# Patient Record
Sex: Male | Born: 1960 | Race: White | Hispanic: No | Smoking: Never smoker
Health system: Southern US, Community
[De-identification: ages and names within clinical notes are randomized; demographics above are authoritative.]

## PROBLEM LIST (undated history)

## (undated) DIAGNOSIS — G4730 Sleep apnea, unspecified: Secondary | ICD-10-CM

## (undated) DIAGNOSIS — R030 Elevated blood-pressure reading, without diagnosis of hypertension: Secondary | ICD-10-CM

## (undated) DIAGNOSIS — E119 Type 2 diabetes mellitus without complications: Secondary | ICD-10-CM

## (undated) DIAGNOSIS — I10 Essential (primary) hypertension: Secondary | ICD-10-CM

## (undated) DIAGNOSIS — Z9109 Other allergy status, other than to drugs and biological substances: Secondary | ICD-10-CM

## (undated) DIAGNOSIS — Z9889 Other specified postprocedural states: Secondary | ICD-10-CM

## (undated) HISTORY — DX: Other allergy status, other than to drugs and biological substances: Z91.09

## (undated) HISTORY — DX: Type 2 diabetes mellitus without complications: E11.9

## (undated) HISTORY — PX: WISDOM TOOTH EXTRACTION: SHX21

## (undated) HISTORY — DX: Elevated blood-pressure reading, without diagnosis of hypertension: R03.0

## (undated) HISTORY — DX: Sleep apnea, unspecified: G47.30

---

## 1979-07-24 HISTORY — PX: SHOULDER SURGERY: SHX246

## 2002-12-17 ENCOUNTER — Encounter: Payer: Self-pay | Admitting: Emergency Medicine

## 2002-12-17 ENCOUNTER — Emergency Department (HOSPITAL_COMMUNITY): Admission: EM | Admit: 2002-12-17 | Discharge: 2002-12-17 | Payer: Self-pay | Admitting: Emergency Medicine

## 2004-09-05 ENCOUNTER — Inpatient Hospital Stay (HOSPITAL_COMMUNITY): Admission: EM | Admit: 2004-09-05 | Discharge: 2004-09-05 | Payer: Self-pay | Admitting: Emergency Medicine

## 2008-01-27 ENCOUNTER — Emergency Department (HOSPITAL_COMMUNITY): Admission: EM | Admit: 2008-01-27 | Discharge: 2008-01-28 | Payer: Self-pay | Admitting: Emergency Medicine

## 2010-12-08 NOTE — Discharge Summary (Signed)
NAME:  Pedro Moyer, Pedro Moyer NO.:  [id number]   MEDICAL RECORD NO.:  [id number]          PATIENT TYPE:  INP   LOCATION:  1830                         FACILITY:  [facility]   PHYSICIAN:  Pedro Moyer, M.D. DATE OF BIRTH:  [birth year]   DATE OF ADMISSION:  09/05/2004  DATE OF DISCHARGE:  09/05/2004                                 DISCHARGE SUMMARY   DISCHARGE DIAGNOSES:  1.  Chest pain, negative myocardial infarction.      1.  Cardiac catheterization with patent coronary arteries.      2.  Most likely gastrointestinal source of pain.  2.  Obstructive sleep apnea.  3.  Gastroesophageal reflux disease.   CONDITION ON DISCHARGE:  Stable and improved.   PROCEDURES:  Heart cath on September 05, 2004, by Dr. Julieanne Moyer, with  patent coronary arteries.   DISCHARGE MEDICATIONS:  Protonix 40 mg daily.   ACTIVITY:  No strenuous activity for three days.   WOUND CARE:  For right groin cath site, please call our office with any  bleeding, swelling, or drainage.   FOLLOWUP:  See Dr. Clarene Moyer on September 06, 2004, at his office at 9:15 a.m.   HISTORY OF PRESENT ILLNESS:  Was asked by Dr. Lendell Moyer to see 50 year old  white male, also per the request of the patient to see Dr. Clarene Moyer.  The  patient in his usual state of health, but has had a significant amount of  stress secondary to going through a marital separation with his second wife  and many social issues involved.  On the morning of September 05, 2004, he  was awakened by left anterior chest pain with radiation down his left arm,  felt like he could not breathe well even with his CPAP.  Pain improved with  nitroglycerin paste in the ER.  He was pain-free when he was seen by  cardiology in the ER, sinus rhythm with incomplete right bundle branch  block.  He took two aspirin at home prior to arrival.   PAST MEDICAL HISTORY:  1.  Cardiac negative.  2.  Obstructive sleep apnea with CPAP.  3.  Gastroesophageal reflux  disease.   ALLERGIES:  CODEINE, no shellfish allergies.   OUTPATIENT MEDICATIONS:  None.   FAMILY HISTORY:  Mother died of lung cancer, father died of cirrhosis of the  liver.  One grandmother died with a MI, but she was elderly.  Six brothers,  one sister, no coronary disease.   SOCIAL HISTORY:  Separated from his second wife.  His first wife is a Engineer, civil (consulting)  that is a friend and supportive.  Children:  One daughter and one son.  No  exercise.  No tobacco or alcohol use, no drugs.   REVIEW OF SYSTEMS:  See H&P.   PHYSICAL EXAMINATION:  VITAL SIGNS:  Blood pressure 139/82, pulse 70,  respirations 18, temperature 97.4, oxygen saturation on room air is 96%.  GENERAL:  Alert and oriented x3.  No acute distress.  SKIN:  Warm and dry, brisk capillary refill.  HEENT:  Sclerae clear.  NECK:  Supple, no JVD, no bruits.  HEART:  S1 and S2, regular rate and rhythm.  LUNGS:  Clear without wheezes, rhonchi, or rales.  ABDOMEN:  Positive bowel sounds, soft, nontender.  EXTREMITIES:  2+ pedal pulses, no lower extremity edema.  NEUROLOGIC:  Alert and oriented x3.  Moves all extremities.   LABORATORY DATA:  Hemoglobin 14.8, hematocrit 41.7, platelets 222, and WBC's  6.1.  Sodium 139, potassium 3.8, BUN 14, creatinine 1.3.  MB's are 2.6 and  1.5, troponin-I are less than 0.05 x2, and myoglobin was 170 to 278.  On EKG  showed sinus rhythm with an incomplete right bundle branch block.  Portable  chest x-ray showed no active disease.  Cardiac catheterization showed patent  coronary arteries, normal left ventricular systolic function, ejection  fraction 72%.   HOSPITAL COURSE:  [name] was admitted by Dr. Lendell Moyer on call for Dr.  Manus Moyer as hospitalist, and asked for a cardiology consult secondary to  chest pain.  Consult was done.  The patient when given options of stress  test versus cardiac catheterization with his ex-wife's input, felt cardiac  catheterization would satisfy his mind better with  the pain that awakened  him in the middle of the night.  Dr. Clarene Moyer saw him and assessed him.  Plans  were made for cardiac catheterization.   Cardiac catheterization was completed without complications.  The patient  did indeed have patent coronary arteries and normal EF.  Reassured from a  cardiology standpoint.  Since he does have gastroesophageal reflux disease,  I will put him on Protonix 40 mg daily.   Dr. Clarene Moyer notified Dr. Lendell Moyer of results and with her permission we are  discharging him after his bed rest from his cardiac catheterization.      LRI/MEDQ  D:  09/05/2004  T:  09/05/2004  Job:  [id number]   cc:   Pedro L. Pedro Caprice, MD   Pedro Moyer, M.D.  [address] [place]  [place] [address]  Fax: [phone]

## 2010-12-08 NOTE — Cardiovascular Report (Signed)
NAME:  Pedro Moyer, Pedro Moyer NO.:  [id number]   MEDICAL RECORD NO.:  [id number]          PATIENT TYPE:  INP   LOCATION:  1830                         FACILITY:  [facility]   PHYSICIAN:  Thereasa Solo. Little, M.D. DATE OF BIRTH:  [birth year]   DATE OF PROCEDURE:  09/05/2004  DATE OF DISCHARGE:                              CARDIAC CATHETERIZATION   This 50 year old obese male presented with left upper chest and arm  discomfort, presented to the emergency room with chest pain suspicious for  angina.  He has a negative family history for heart disease, but does have  obstructive sleep apnea.   After obtaining informed consent, the patient was prepped and draped in the  usual sterile fashion, exposing the right groin.  Following local anesthetic  with 1% Xylocaine, the Seldinger technique was employed and a 5 [place]  __________ contrast introducer sheath was placed into the right femoral  artery.  Left and right coronary arteriography and ventriculography in the  RAO projection was performed.   COMPLICATIONS:  None.   EQUIPMENT:  5 French Judkins configuration catheters.   TOTAL CONTRAST:  90 mL.   RESULTS:   HEMODYNAMIC MONITORING:  Central aortic pressure 119/77.  Left ventricular  pressure 119/15.  No aortic valve gradient was noted at the time of pull-  back.   VENTRICULOGRAPHY:  Ventriculography in the ROA projection revealed normal LV  systolic function, ejection fraction greater than 60%.  End-diastolic  pressure was 24.   CORONARY ARTERIOGRAPHY:  1.  Left main normal.  It bifurcated.  2.  LAD:  The LAD was a large vessel greater than 4 mm.  There were two      diagonal vessels, all of which were free of disease.  3.  Circumflex:  The circumflex gave rise to two OMs.  The first OM      bifurcated.  The second OM was moderate-sized, both of which were      normal.  4.  Right coronary artery:  The right coronary artery was a large vessel at      3.5 mm, giving rise  to the PDA and posterolateral vessel.  They were      free of disease.   CONCLUSION:  1.  Normal left ventricular systolic function.  2.  No evidence of coronary artery disease.   I cannot explain his chest pain from a cardiac standpoint.  I see no reason  he needs to stay in the hospital overnight.  We will discharge home if okay  with Dr. Lendell Caprice.      ABL/MEDQ  D:  09/05/2004  T:  09/05/2004  Job:  [id number]   cc:   Bryan Lemma. Manus Gunning, M.D.  [address] [place]  [place] [address]  Fax: [phone]   Cath Lab

## 2010-12-08 NOTE — H&P (Signed)
NAME:  Pedro Moyer, Pedro Moyer NO.:  [id number]   MEDICAL RECORD NO.:  [id number]          PATIENT TYPE:  EMS   LOCATION:  MAJO                         FACILITY:  [facility]   PHYSICIAN:  Duke Salvia, M.D.  DATE OF BIRTH:  [birth year]   DATE OF ADMISSION:  09/05/2004  DATE OF DISCHARGE:                                HISTORY & PHYSICAL   CHIEF COMPLAINT:  Chest pressure.   HISTORY OF PRESENT ILLNESS:  Pedro Moyer is a 50 year old white male patient of  Dr. Manus Gunning, who presents to the emergency room with chest pressure and pain  radiating down his left arm.  He reports that his pain has resolved.  He was  asleep when it started.  He had no accompanying symptoms.  He has felt this  in the past, mainly related to stress.  He has noted that he has been under  a lot of stress recently.  He also has a history of reflux symptoms, but is  on no regular medications for this.  He will take Pepcid and TUMS as needed.  He has never had a stress test.  He has no cardiac risk factors.   PAST MEDICAL HISTORY:  Obstructive sleep apnea for which he takes CPAP at  night.   MEDICATIONS:  1.  He took two aspirin today and takes it approximately weekly for this      chest pressure.  2.  He takes Pepcid and TUMS as needed.   FAMILY HISTORY:  His mother died of lung cancer.  His father died of  cirrhosis from alcohol.   SOCIAL HISTORY:  The patient does not drink, smoke, or use drugs.  He is a  Medical illustrator.  He is recently separated from his wife.   REVIEW OF SYSTEMS:  GENERAL:  No fevers, chills, or weight loss.  HEENT:  No headache, no sore throat.  No rhinorrhea.  LUNGS:  No cough, no shortness of breath.  HEART:  As above.  GASTROINTESTINAL:  He reports frequent heartburn.  GENITOURINARY:  No dysuria or hematuria.  MUSCULOSKELETAL:  No arthralgias or myalgias.  SKIN:  No rash.  PSYCHIATRIC:  As above.  NEUROLOGY:  No stroke or seizure.  ENDOCRINE:  No diabetes.  HEMATOLOGIC:  No  history of thromboembolism.   PHYSICAL EXAMINATION:  VITAL SIGNS:  Temperature 97.4, blood pressure  139/82, pulse 70, respiratory rate 18, oxygen saturation 96% on room air.  GENERAL:  The patient is well-nourished, well-developed, and in no acute  distress.  HEENT:  Normocephalic and atraumatic.  Pupils equal, round, and reactive to  light.  Sclerae nonicteric.  Moist mucous membranes.  NECK:  Thick and supple.  No carotid bruits.  No thyromegaly.  No  lymphadenopathy.  LUNGS:  Clear to auscultation bilaterally without wheezes, rhonchi, or  rales.  HEART:  Regular rate and rhythm without murmurs, rubs, or gallops.  No chest  wall tenderness.  ABDOMEN:  Soft, nontender, and nondistended.  GENITOURINARY:  RECTAL:  Deferred.  EXTREMITIES:  No cyanosis, clubbing, or edema.  No calf tenderness.  Homan's  sign negative.  Pulses  are intact.  PSYCHIATRIC:  Normal affect.  NEUROLOGY:  Alert and oriented.  Cranial nerves and sensory, motor  examination are intact.  SKIN:  No rash.   LABORATORY DATA:  First set of point of care enzymes were normal.  Repeat  point of care enzymes show myoglobin of 278, troponin and MB fraction were  normal.  Basic metabolic panel unremarkable.  CBC unremarkable.  EKG shows  normal sinus rhythm.  Chest x-ray negative.   ASSESSMENT:  1.  Chest pain.  The patient will be admitted to telemetry.  I will continue      the aspirin.  He has received Nitropaste and I will continue this.  I      will continue oxygen.  He will get serial cardiac enzymes.  He will most      likely need a stress Cardiolite.  He requests that Gaspar Garbe B. Little,      M.D. be the consulting physician for this test.  Apparently his      patient's ex-wife is a nurse on the oncology unit and knows Dr. Clarene Duke.      I have called the consult in to Dr. Fredirick Maudlin office.  2.  Obstructive sleep apnea.  Continue CPAP.  3.  Gastroesophageal reflux disease.  I will start the patient on  Protonix.      CLS/MEDQ  D:  09/05/2004  T:  09/05/2004  Job:  [id number]   cc:   Bryan Lemma. Manus Gunning, M.D.  [address] [place]  [place] [address]  Fax: [phone]

## 2013-09-10 ENCOUNTER — Institutional Professional Consult (permissible substitution): Payer: Self-pay | Admitting: Internal Medicine

## 2014-02-05 ENCOUNTER — Institutional Professional Consult (permissible substitution): Payer: Self-pay | Admitting: Internal Medicine

## 2014-03-19 ENCOUNTER — Encounter (INDEPENDENT_AMBULATORY_CARE_PROVIDER_SITE_OTHER): Payer: Self-pay

## 2014-03-19 ENCOUNTER — Ambulatory Visit (INDEPENDENT_AMBULATORY_CARE_PROVIDER_SITE_OTHER): Payer: Managed Care, Other (non HMO) | Admitting: Internal Medicine

## 2014-03-19 ENCOUNTER — Encounter: Payer: Self-pay | Admitting: Internal Medicine

## 2014-03-19 VITALS — BP 132/68 | HR 67

## 2014-03-19 DIAGNOSIS — G4733 Obstructive sleep apnea (adult) (pediatric): Secondary | ICD-10-CM

## 2014-03-19 NOTE — Progress Notes (Signed)
03/19/14- 53 yoM never smoker Former patient-[facility];had sleep study about 16 years ago, coming to re-establish. Says he is still using the same machine with no adjustment, all night, every night with no problems. No recent masks. Straps are worn out.  Bedtime 10-11PM, latency 5-10 minutes, waking 3-4 times before up 6-7AM. Weight up 10 lbs since dx. No ENT surgery, "borderline" HBP, some nasal congestion/ allergic rhinitis. Married, works for [employer]. Brother has OSA.  Prior to Admission medications   Not on File   Past Medical History  Diagnosis Date  . Sleep apnea   . Borderline hypertension   . Environmental allergies    Past Surgical History  Procedure Laterality Date  . Shoulder surgery  1981   Family History  Problem Relation Age of Onset  . Lung cancer Mother   . Other Father     blood disease-ETOH abuse   History   Social History  . Marital Status: Married    Spouse Name: N/A    Number of Children: 5  . Years of Education: N/A   Occupational History  . 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 Smoking status: Never Smoker   . Smokeless tobacco: Not on file  . Alcohol Use: No  . Drug Use: No  . Sexual Activity: Not on file   Other Topics Concern  . Not on file   Social History Narrative  . No narrative on file   ROS-see HPI Constitutional:   No-   weight loss, night sweats, fevers, chills, fatigue, lassitude. HEENT:   No-  headaches, difficulty swallowing, tooth/dental problems, sore throat,       No-  sneezing, itching, ear ache, nasal congestion, post nasal drip,  CV:  No-   chest pain, orthopnea, PND, swelling in lower extremities, anasarca,                                  dizziness, palpitations Resp: No-   shortness of breath with exertion or at rest.              No-   productive cough,  No non-productive cough,  No- coughing up of blood.              No-   change in color of mucus.  No- wheezing.   Skin: No-   rash or  lesions. GI:  No-   heartburn, indigestion, abdominal pain, nausea, vomiting, diarrhea,                 change in bowel habits, loss of appetite GU: No-   dysuria, change in color of urine, no urgency or frequency.  No- flank pain. MS:  No-   joint pain or swelling.  No- decreased range of motion.  No- back pain. Neuro-     nothing unusual Psych:  No- change in mood or affect. No depression or anxiety.  No memory loss.  OBJ- Physical Exam General- Alert, Oriented, Affect-appropriate, Distress- none acute, mild overweight Skin- rash-none, lesions- none, excoriation- none Lymphadenopathy- none Head- atraumatic            Eyes- Gross vision intact, PERRLA, conjunctivae and secretions clear            Ears- Hearing, canals-normal            Nose- Clear, no-Septal dev, mucus, polyps, erosion, perforation  Throat- Mallampati III , mucosa clear , drainage- none, tonsils- atrophic Neck- flexible , trachea midline, no stridor , thyroid nl, carotid no bruit Chest - symmetrical excursion , unlabored           Heart/CV- RRR , no murmur , no gallop  , no rub, nl s1 s2                           - JVD- none , edema- none, stasis changes- none, varices- none           Lung- clear to P&A, wheeze- none, cough- none , dullness-none, rub- none           Chest wall-  Abd- tender-no, distended-no, bowel sounds-present, HSM- no Br/ Gen/ Rectal- Not done, not indicated Extrem- cyanosis- none, clubbing, none, atrophy- none, strength- nl Neuro- grossly intact to observation

## 2014-03-19 NOTE — Assessment & Plan Note (Signed)
After 16 years, we will need new sleep study to update documentation and support prescription for new machine, supplies and pressure setting.

## 2014-03-19 NOTE — Patient Instructions (Signed)
Order- schedule split protocol NPSG    Dx OSA 

## 2014-04-27 ENCOUNTER — Ambulatory Visit (HOSPITAL_BASED_OUTPATIENT_CLINIC_OR_DEPARTMENT_OTHER): Payer: Managed Care, Other (non HMO) | Attending: Internal Medicine | Admitting: Radiology

## 2014-04-27 VITALS — Ht 71.0 in | Wt 239.0 lb

## 2014-04-27 DIAGNOSIS — G4730 Sleep apnea, unspecified: Secondary | ICD-10-CM | POA: Diagnosis present

## 2014-04-27 DIAGNOSIS — Z9989 Dependence on other enabling machines and devices: Secondary | ICD-10-CM

## 2014-04-27 DIAGNOSIS — G4733 Obstructive sleep apnea (adult) (pediatric): Secondary | ICD-10-CM | POA: Diagnosis not present

## 2014-05-05 DIAGNOSIS — G4733 Obstructive sleep apnea (adult) (pediatric): Secondary | ICD-10-CM

## 2014-05-05 NOTE — Sleep Study (Addendum)
  NAME: Pedro Moyer  DATE OF BIRTH: 01/14/1961  MEDICAL RECORD [id number]  LOCATION: [facility]   PHYSICIAN: Airiel Oblinger D   DATE OF STUDY: 04/27/2014   SLEEP STUDY TYPE: Nocturnal Polysomnogram   REFERRING PHYSICIAN: Jetty DuhamelYoung, Collen Hostler D, MD   INDICATION FOR STUDY: Hypersomnia with sleep apnea   EPWORTH SLEEPINESS SCORE: 9/24   HEIGHT: 5\' 11"  (180.3 cm)  WEIGHT: 239 lb (108.41 kg) Body mass index is 33.35 kg/(m^2).  NECK SIZE: 17 in.  MEDICATIONS: Charted for review   SLEEP ARCHITECTURE: Sleep study protocol. During the diagnostic phase, total sleep time 121.5 minutes with sleep efficiency 60%. Stage I was 27.2%, stage II 72.8%, stage III and REM were absent. Sleep latency 4 minutes, awake after sleep onset 70.5 minutes, arousal index 74.6, bedtime medication: None   RESPIRATORY DATA: Apnea hypopneas index (AHI) 69.6 per hour. 141 total events scored including 88 obstructive apneas, 28 central apneas, 9 mixed apneas, 16 hypopneas. Events were not positional.  CPAP titration to 10 CWP, AHI 0 per hour. He wore a large nasal mask.   OXYGEN DATA: Moderate snoring before CPAP with oxygen desaturation to a nadir of 88% on room air. With CPAP control, snoring was prevented and mean oxygen saturation was 95.2%.   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 Moderate snoring with oxygen desaturation to a nadir of 88% on room air.  2) Successful CPAP titration to 10 CWP, AHI 0 per hour. He wore a large Repironics WISP nasal mask with heated humidifier. Snoring was prevented and mean oxygen saturation was 95.2% on room air.  Waymon BudgeYOUNG,Sammuel Blick D  Diplomate, American Board of Sleep Medicine  ELECTRONICALLY SIGNED ON: 05/05/2014, 1:48 PM   [facility]  PH: [phone] FX: [phone]) [phone]  ACCREDITED BY THE AMERICAN ACADEMY OF SLEEP MEDICINE

## 2014-05-05 NOTE — Sleep Study (Deleted)
   NAME: Pedro LangoRichard L Moyer DATE OF BIRTH:  May 16, 1961 MEDICAL RECORD NUMBER [id number]  LOCATION: [facility]  PHYSICIAN: Kalila Adkison D  DATE OF STUDY: 04/27/2014  SLEEP STUDY TYPE: Nocturnal Polysomnogram               REFERRING PHYSICIAN: Jetty DuhamelYoung, Nayef College D, MD  INDICATION FOR STUDY: Hypersomnia with sleep apnea  EPWORTH SLEEPINESS SCORE:   9/24 HEIGHT: 5\' 11"  (180.3 cm)  WEIGHT: 239 lb (108.41 kg)    Body mass index is 33.35 kg/(m^2).  NECK SIZE: 17 in.  MEDICATIONS: Charted for review  SLEEP ARCHITECTURE: Sleep study protocol. During the diagnostic phase, total sleep time 121.5 minutes with sleep efficiency 60%. Stage I was 27.2%, stage II 72.8%, stage III and REM were absent. Sleep latency 4 minutes, awake after sleep onset 70.5 minutes, arousal index 74.6, bedtime medication: None  RESPIRATORY DATA: Apnea hypopneas index (AHI) 69.6 per hour. 141 total events scored including 88 obstructive apneas, 28 central apneas, 9 mixed apneas, 16 hypopneas. Events were not positional. CPAP titration to 10 CWP, AHI 0 per hour. He wore a large nasal mask.  OXYGEN DATA: Moderate snoring before CPAP with oxygen desaturation to a nadir of 88% on room air. With CPAP control, snoring was prevented and mean oxygen saturation was 95.2%.  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 Moderate snoring with oxygen desaturation to a nadir of 88% on room air. 2) Successful CPAP titration to 10 CWP, AHI 0 per hour. He wore a large Repironix    Waymon BudgeYOUNG,Britni Driscoll D Diplomate, Biomedical engineerAmerican Board of Sleep Medicine  ELECTRONICALLY SIGNED ON:  05/05/2014, 1:48 PM [facility] PH: [phone]   FX: [phone]) [phone] ACCREDITED BY THE AMERICAN ACADEMY OF SLEEP  MEDICINE

## 2014-05-13 ENCOUNTER — Ambulatory Visit: Payer: Managed Care, Other (non HMO) | Admitting: Internal Medicine

## 2014-05-17 ENCOUNTER — Encounter: Payer: Self-pay | Admitting: Internal Medicine

## 2014-05-17 ENCOUNTER — Ambulatory Visit (INDEPENDENT_AMBULATORY_CARE_PROVIDER_SITE_OTHER): Payer: Managed Care, Other (non HMO) | Admitting: Internal Medicine

## 2014-05-17 ENCOUNTER — Encounter (INDEPENDENT_AMBULATORY_CARE_PROVIDER_SITE_OTHER): Payer: Self-pay

## 2014-05-17 VITALS — BP 176/100 | HR 75 | Ht 71.0 in | Wt 240.8 lb

## 2014-05-17 DIAGNOSIS — G4733 Obstructive sleep apnea (adult) (pediatric): Secondary | ICD-10-CM

## 2014-05-17 NOTE — Patient Instructions (Signed)
Order- new DME new CPAP 10, mask of choice, humidifier, supplies     Dx OSA

## 2014-05-17 NOTE — Progress Notes (Signed)
03/19/14- 53 yoM never smoker Former patient-[facility];had sleep study about 16 years ago, coming to re-establish. Says he is still using the same machine with no adjustment, all night, every night with no problems. No recent masks. Straps are worn out.  Bedtime 10-11PM, latency 5-10 minutes, waking 3-4 times before up 6-7AM. Weight up 10 lbs since dx. No ENT surgery, "borderline" HBP, some nasal congestion/ allergic rhinitis. Married, works for [employer]. Brother has OSA.  05/17/14- 53 yoM never smoker followed for OSA FOLLOW FOR:  OSA; discuss sleep study results;  current CPAP 54 years old, set at 15cm; uses every night 8 hours nightly NPSG 04/27/14- severe obstructive and central sleep apnea, AHI 69.6 per hour, C Pap 10, weight 239 pounds He qualifies to get a new machine. We discussed sleep hygiene made difficult because his wife is a night owl and her noise at night disturbed him  ROS-see HPI Constitutional:   No-   weight loss, night sweats, fevers, chills, fatigue, lassitude. HEENT:   No-  headaches, difficulty swallowing, tooth/dental problems, sore throat,       No-  sneezing, itching, ear ache, nasal congestion, post nasal drip,  CV:  No-   chest pain, orthopnea, PND, swelling in lower extremities, anasarca,                                  dizziness, palpitations Resp: No-   shortness of breath with exertion or at rest.              No-   productive cough,  No non-productive cough,  No- coughing up of blood.              No-   change in color of mucus.  No- wheezing.   Skin: No-   rash or lesions. GI:  No-   heartburn, indigestion, abdominal pain, nausea, vomiting, GU:  MS:  No-   joint pain or swelling.   Neuro-     nothing unusual Psych:  No- change in mood or affect. No depression or anxiety.  No memory loss.  OBJ- Physical Exam General- Alert, Oriented, Affect-appropriate, Distress- none acute, mild overweight Skin- rash-none, lesions- none, excoriation- none Lymphadenopathy-  none Head- atraumatic            Eyes- Gross vision intact, PERRLA, conjunctivae and secretions clear            Ears- Hearing, canals-normal            Nose- Clear, no-Septal dev, mucus, polyps, erosion, perforation             Throat- Mallampati III , mucosa clear , drainage- none, tonsils- atrophic Neck- flexible , trachea midline, no stridor , thyroid nl, carotid no bruit Chest - symmetrical excursion , unlabored           Heart/CV- RRR , no murmur , no gallop  , no rub, nl s1 s2                           - JVD- none , edema- none, stasis changes- none, varices- none           Lung- clear to P&A, wheeze- none, cough- none , dullness-none, rub- none           Chest wall-  Abd-  Br/ Gen/ Rectal- Not done, not indicated Extrem- cyanosis- none, clubbing,  none, atrophy- none, strength- nl Neuro- grossly intact to observation

## 2014-05-23 NOTE — Assessment & Plan Note (Signed)
Severe obstructive sleep apnea qualifying for the replacement CPAP machine he needs.

## 2014-07-19 ENCOUNTER — Ambulatory Visit: Payer: Managed Care, Other (non HMO) | Admitting: Internal Medicine

## 2014-11-10 ENCOUNTER — Telehealth: Payer: Self-pay | Admitting: Internal Medicine

## 2014-11-10 DIAGNOSIS — G4733 Obstructive sleep apnea (adult) (pediatric): Secondary | ICD-10-CM

## 2014-11-10 NOTE — Telephone Encounter (Signed)
Order has been placed for new machine. Advised that he would need appointment soon. ROV has been placed for 12/29/14 at 10:45am.

## 2014-12-06 ENCOUNTER — Telehealth: Payer: Self-pay | Admitting: Internal Medicine

## 2014-12-06 NOTE — Telephone Encounter (Signed)
No forms on pt as of today-what type of form is this and which fax number are they sending it to?  Thanks.

## 2014-12-06 NOTE — Telephone Encounter (Signed)
Katie, have you received anything  Please advise thanks

## 2014-12-07 NOTE — Telephone Encounter (Signed)
Received mold report fax. Called and spoke to pt's wife. Pt is needing additional information sent to insurance for CPAP to be covered--as pt had a new CPAP in November of 2015 and is now needing another one. Report and order faxed to [employer] at [phone] attention to PA as a STAT order. Pt's wife aware. [facility].   Will forward to [place] to follow.

## 2014-12-09 NOTE — Telephone Encounter (Signed)
Received fax from [employer] last Wednesday 12-08-14 afternoon; they state they are missing information (CPT code or other code(s) for CPAP machine needed). I have contacted [employer](pts DME company) and was given codes as follows:   E0601= CPAP machine E0562= humidifier for CPAP machine   I have faxed the following information to [phone]. Will wait for a decision from [employer].

## 2014-12-14 ENCOUNTER — Telehealth: Payer: Self-pay | Admitting: Internal Medicine

## 2014-12-14 NOTE — Telephone Encounter (Signed)
[facility] can you please advise what was faxed?

## 2014-12-14 NOTE — Telephone Encounter (Signed)
Pt himself is calling back and he says the info that was faxed for pt c-pap was waxed to wrong# which was the # they had given us, apparently they were wanting it faxed to the sleep management dept @ [phone] please advise as to if this can be faxed to them.Caren GriffinsStanley A Dalton'

## 2014-12-14 NOTE — Telephone Encounter (Signed)
The order for the new CPAP was placed in April 2016. This order went to [facility]. Called and spoke with Okey Regalarol at [facility]. She states that their hands are tied because he needs to call his insurance company. Okey RegalCarol has explained this to the pt's wife several times. Attempted to call pt's wife back but her voicemail has not been set up. Will try back.

## 2014-12-15 NOTE — Telephone Encounter (Signed)
Printed order and refaxed it to [phone] per pt's request tried to reach pt but no voice mail has been set up on his phone Tobe SosSally E Ottinger

## 2014-12-29 ENCOUNTER — Ambulatory Visit: Payer: Managed Care, Other (non HMO) | Admitting: Internal Medicine

## 2015-01-26 ENCOUNTER — Encounter: Payer: Self-pay | Admitting: Internal Medicine

## 2015-02-28 ENCOUNTER — Encounter: Payer: Self-pay | Admitting: Internal Medicine

## 2015-03-31 ENCOUNTER — Encounter: Payer: Self-pay | Admitting: Internal Medicine

## 2016-04-17 ENCOUNTER — Emergency Department (HOSPITAL_COMMUNITY): Payer: Managed Care, Other (non HMO)

## 2016-04-17 ENCOUNTER — Encounter (HOSPITAL_COMMUNITY): Payer: Self-pay | Admitting: Emergency Medicine

## 2016-04-17 ENCOUNTER — Telehealth: Payer: Self-pay | Admitting: Internal Medicine

## 2016-04-17 ENCOUNTER — Emergency Department (HOSPITAL_COMMUNITY)
Admission: EM | Admit: 2016-04-17 | Discharge: 2016-04-17 | Disposition: A | Discharge status: Home / Self Care | Payer: Managed Care, Other (non HMO) | Source: Home / Self Care | Attending: Emergency Medicine | Admitting: Emergency Medicine

## 2016-04-17 DIAGNOSIS — R0600 Dyspnea, unspecified: Secondary | ICD-10-CM | POA: Diagnosis not present

## 2016-04-17 DIAGNOSIS — R0602 Shortness of breath: Secondary | ICD-10-CM | POA: Insufficient documentation

## 2016-04-17 DIAGNOSIS — R739 Hyperglycemia, unspecified: Secondary | ICD-10-CM | POA: Insufficient documentation

## 2016-04-17 DIAGNOSIS — I10 Essential (primary) hypertension: Secondary | ICD-10-CM

## 2016-04-17 HISTORY — DX: Essential (primary) hypertension: I10

## 2016-04-17 LAB — BASIC METABOLIC PANEL
Anion gap: 10 (ref 5–15)
BUN: 12 mg/dL (ref 6–20)
CALCIUM: 9.3 mg/dL (ref 8.9–10.3)
CO2: 21 mmol/L — ABNORMAL LOW (ref 22–32)
CREATININE: 0.97 mg/dL (ref 0.61–1.24)
Chloride: 104 mmol/L (ref 101–111)
GFR calc Af Amer: >60 mL/min (ref >60)
GLUCOSE: 428 mg/dL — AB (ref 65–99)
Potassium: 3.6 mmol/L (ref 3.5–5.1)
SODIUM: 135 mmol/L (ref 135–145)

## 2016-04-17 LAB — CBC
HEMATOCRIT: 44.2 % (ref 39.0–52.0)
Hemoglobin: 16.1 g/dL (ref 13.0–17.0)
MCH: 29.5 pg (ref 26.0–34.0)
MCHC: 36.4 g/dL — AB (ref 30.0–36.0)
MCV: 81.1 fL (ref 78.0–100.0)
PLATELETS: 243 10*3/uL (ref 150–400)
RBC: 5.45 MIL/uL (ref 4.22–5.81)
RDW: 13.3 % (ref 11.5–15.5)
WBC: 7.8 10*3/uL (ref 4.0–10.5)

## 2016-04-17 LAB — I-STAT TROPONIN, ED: Troponin i, poc: 0.01 ng/mL (ref 0.00–0.08)

## 2016-04-17 LAB — BRAIN NATRIURETIC PEPTIDE: B NATRIURETIC PEPTIDE 5: 24.2 pg/mL (ref 0.0–100.0)

## 2016-04-17 MED ORDER — ALBUTEROL SULFATE HFA 108 (90 BASE) MCG/ACT IN AERS
1.0000 | INHALATION_SPRAY | Freq: Once | RESPIRATORY_TRACT | Status: AC
  Administered 2016-04-17: 1 via RESPIRATORY_TRACT
  Filled 2016-04-17: qty 6.7

## 2016-04-17 NOTE — ED Triage Notes (Addendum)
Pt. reports SOB with productive cough and chest congestion  onset this morning , denies fever or chills.

## 2016-04-17 NOTE — ED Provider Notes (Signed)
[facility]EMER[facility]Y DEPT Provider Note   CSN: [id number] Arrival date & time: 04/17/16  [id number]     History   Chief Complaint Chief Complaint  Patient presents with  . Shortness of Breath    HPI Pedro Moyer is a 55 y.o. male.  The history is provided by the patient and medical records. No language interpreter was used.  Shortness of Breath  Associated symptoms include cough (Chronic - no change from usual). Pertinent negatives include no fever, no headaches, no chest pain, no abdominal pain, no rash and no leg swelling.   Pedro Moyer is a 55 y.o. male  with a PMH of sleep apnea, HTN who presents to the Emergency Department complaining of shortness of breath that began this morning around 3am. Patient states that he woke up to use the bathroom and felt like he couldn't get a deep breath. He then started feeling anxious and feels like that worsened breathing. He endorses chronic cough with occasional phlegm but no change in cough over the last week. No fevers, congestion, chest pain, n/v, diaphoresis or back pain.   Past Medical History:  Diagnosis Date  . Borderline hypertension   . Environmental allergies   . Hypertension   . Sleep apnea     Patient Active Problem List   Diagnosis Date Noted  . Obstructive sleep apnea 03/19/2014    Past Surgical History:  Procedure Laterality Date  . SHOULDER SURGERY  1981       Home Medications    Prior to Admission medications   Not on File    Family History Family History  Problem Relation Age of Onset  . Lung cancer Mother   . Other Father     blood disease-ETOH abuse    Social History Social History  Substance Use Topics  . Smoking status: Never Smoker  . Smokeless tobacco: Never Used  . Alcohol use No     Allergies   Codeine   Review of Systems Review of Systems  Constitutional: Negative for fever.  HENT: Negative for congestion.   Eyes: Negative for visual disturbance.  Respiratory: Positive for cough  (Chronic - no change from usual) and shortness of breath. Negative for chest tightness.   Cardiovascular: Negative for chest pain, palpitations and leg swelling.  Gastrointestinal: Negative for abdominal pain.  Genitourinary: Negative for dysuria.  Musculoskeletal: Negative for back pain.  Skin: Negative for rash.  Neurological: Negative for headaches.     Physical Exam Updated Vital Signs BP 163/87 (BP Location: Right Arm)   Pulse 79   Temp 98.2 F (36.8 C) (Oral)   Resp 18   SpO2 99%   Physical Exam  Constitutional: He is oriented to person, place, and time. He appears well-developed and well-nourished. No distress.  Well appearing male speaking in full sentences, NAD.   HENT:  Head: Normocephalic and atraumatic.  OP clear and moist. No exudate.   Cardiovascular: Normal rate, regular rhythm, normal heart sounds and intact distal pulses.  Exam reveals no gallop and no friction rub.   No murmur heard. Pulmonary/Chest: Effort normal and breath sounds normal. No respiratory distress. He has no wheezes. He has no rales. He exhibits no tenderness.  99-100% O2 on RA throughout examination.   Abdominal: Soft. Bowel sounds are normal. He exhibits no distension. There is no tenderness.  Musculoskeletal: He exhibits no edema.  Neurological: He is alert and oriented to person, place, and time.  Skin: Skin is warm and dry.  Nursing note and  vitals reviewed.    ED Treatments / Results  Labs (all labs ordered are listed, but only abnormal results are displayed) Labs Reviewed - No data to display  EKG  EKG Interpretation  Date/Time:  Tuesday April 17 2016 06:43:08 EDT Ventricular Rate:  79 PR Interval:  154 QRS Duration: 92 QT Interval:  370 QTC Calculation: 424 R Axis:   48 Text Interpretation:  Normal sinus rhythm with sinus arrhythmia Incomplete right bundle branch block Borderline ECG When compared with ECG of 09/05/2004, No significant change was found Confirmed by [facility]   MD, DAVID ([id number]) on 04/17/2016 6:55:17 AM       Radiology Dg Chest 2 View  Result Date: 04/17/2016 CLINICAL DATA:  Shortness of breath and productive cough EXAM: CHEST  2 VIEW COMPARISON:  01/28/2008 FINDINGS: Normal heart size and mediastinal contours. No acute infiltrate or edema. No effusion or pneumothorax. No acute osseous findings. IMPRESSION: Negative chest. Electronically Signed   By: Marnee Spring M.D.   On: 04/17/2016 07:18    Procedures Procedures (including critical care time)  Medications Ordered in ED Medications  albuterol (PROVENTIL HFA;VENTOLIN HFA) 108 (90 Base) MCG/ACT inhaler 1-2 puff (1 puff Inhalation Given 04/17/16 0941)     Initial Impression / Assessment and Plan / ED Course  I have reviewed the triage vital signs and the nursing notes.  Pertinent labs & imaging results that were available during my care of the patient were reviewed by me and considered in my medical decision making (see chart for details).  Clinical Course   Pedro Moyer is a 55 y.o. male who presents to ED for shortness of breath that began this morning. No other associated symptoms. EKG reassuring. CXR unremarkable. Patient ambulated around department multiple times with no drop in O2 sats. He is well appearing. Evaluation does not show pathology that would require ongoing emergent intervention or inpatient treatment. Patient is hemodynamically stable and mentating appropriately. Followed by pulmonology for sleep apnea and patient agrees to follow up with pulm this week.  Return precautions discussed and all questions answered.   Patient discussed with Dr. Clayborne Dana who agrees with treatment plan.   Final Clinical Impressions(s) / ED Diagnoses   Final diagnoses:  SOB (shortness of breath)    New Prescriptions There are no discharge medications for this patient.    [facility], PA-C 04/17/16 1000    Marily Memos, MD 04/18/16 [phone]

## 2016-04-17 NOTE — ED Triage Notes (Signed)
Patient seen earlier today for shortness of breath.  Patient states that when he lays down he feels like he is suffocating.  EKG and chest xray normal this morning.  Patient states that he does not have any chest pain at this time.  He does have a cough, nonproductive, but always has a cough.

## 2016-04-17 NOTE — ED Notes (Signed)
Pt ambulating on room air, sats remain >95% and breathing is not labored.

## 2016-04-17 NOTE — Telephone Encounter (Signed)
Spoke with pt and he states that he started having increased ShOB early this morning. Pt was seen in ED this morning. He was given albuterol HFA to use and has noticed some improvement. Pt does admit to some level of anxiety that may be exacerbating his symptoms. Reviewed albuterol instructions and deep breathing techniques with pt. Scheduled appt with CY for tomorrow morning. Pt advised to return to ED if significant increase in symptoms or develops chest pain. Nothing further needed.

## 2016-04-17 NOTE — Discharge Instructions (Signed)
Please follow up with your pulmonologist this week.  Return to ER for chest pain, worsening shortness of breath, fevers, new or worsening symptoms, any additional concerns.

## 2016-04-18 ENCOUNTER — Emergency Department (HOSPITAL_COMMUNITY): Payer: Managed Care, Other (non HMO)

## 2016-04-18 ENCOUNTER — Ambulatory Visit (INDEPENDENT_AMBULATORY_CARE_PROVIDER_SITE_OTHER): Payer: Managed Care, Other (non HMO) | Admitting: Internal Medicine

## 2016-04-18 ENCOUNTER — Emergency Department (HOSPITAL_COMMUNITY)
Admission: EM | Admit: 2016-04-18 | Discharge: 2016-04-18 | Disposition: A | Discharge status: Home / Self Care | Payer: Managed Care, Other (non HMO) | Attending: Emergency Medicine | Admitting: Emergency Medicine

## 2016-04-18 ENCOUNTER — Encounter: Payer: Self-pay | Admitting: Internal Medicine

## 2016-04-18 ENCOUNTER — Encounter (HOSPITAL_COMMUNITY): Payer: Self-pay

## 2016-04-18 VITALS — BP 120/66 | HR 73 | Ht 71.0 in | Wt 222.8 lb

## 2016-04-18 DIAGNOSIS — R0600 Dyspnea, unspecified: Secondary | ICD-10-CM

## 2016-04-18 DIAGNOSIS — R739 Hyperglycemia, unspecified: Secondary | ICD-10-CM

## 2016-04-18 DIAGNOSIS — Z23 Encounter for immunization: Secondary | ICD-10-CM

## 2016-04-18 DIAGNOSIS — G4733 Obstructive sleep apnea (adult) (pediatric): Secondary | ICD-10-CM | POA: Diagnosis not present

## 2016-04-18 LAB — I-STAT TROPONIN, ED: TROPONIN I, POC: 0.01 ng/mL (ref 0.00–0.08)

## 2016-04-18 MED ORDER — IOPAMIDOL (ISOVUE-370) INJECTION 76%
INTRAVENOUS | Status: AC
  Filled 2016-04-18: qty 100

## 2016-04-18 MED ORDER — METFORMIN HCL 500 MG PO TABS: 500.0000 mg | ORAL_TABLET | Freq: Two times a day (BID) | ORAL | 0 refills | Status: DC

## 2016-04-18 MED ORDER — IOPAMIDOL (ISOVUE-370) INJECTION 76%
80.0000 mL | Freq: Once | INTRAVENOUS | Status: AC | PRN
  Administered 2016-04-18: 80 mL via INTRAVENOUS

## 2016-04-18 MED ORDER — METFORMIN HCL 500 MG PO TABS
500.0000 mg | ORAL_TABLET | Freq: Once | ORAL | Status: AC
  Administered 2016-04-18: 500 mg via ORAL
  Filled 2016-04-18: qty 1

## 2016-04-18 MED FILL — metFORMIN HCL 500 MG TABS: 500 | 30 days supply | Qty: 60 | Fill #0

## 2016-04-18 NOTE — ED Notes (Signed)
Pt provided with d/c instructions at this time. Pt verbalizes understanding of d/c instructions as well as follow up procedure after d/c.  Pt provided with RX for metformin 500 mg. Pt verbalizes understanding of RX directions. Pt in no apparent distress at this time.  Pt ambulatory at time of d/c.

## 2016-04-18 NOTE — Assessment & Plan Note (Signed)
Download confirms his good compliance and control with CPAP which he is using all night every night. We can continue present settings. Comfort measures reviewed.

## 2016-04-18 NOTE — ED Provider Notes (Signed)
[facility] Provider Note   CSN: [id number] Arrival date & time: 04/17/16  1922  By signing my name below, I, Clovis Pu, attest that this documentation has been prepared under the direction and in the presence of Azalia Bilis, MD  Electronically Signed: Clovis Pu, ED Scribe. 04/18/16. 2:41 AM.  History   Chief Complaint Chief Complaint  Patient presents with  . Shortness of Breath    The history is provided by the patient. No language interpreter was used.   HPI Comments:  Pedro Moyer is a 55 y.o. male, with a hx of pneumonia and DM, who presents to the Emergency Department complaining of SOB onset at 3 AM 1 day ago. Pt notes he woke up to use the bathroom and was not able to lay down afterward which is when his SOB began. He states his symptoms are exacerbated when laying completely flat. Pt states he currently feels fine but he cannot obtain deep breathes. He denies exacerbation of his symptoms with ambulation. He notes this is a new problem. Pt states he visited the ED at 6 AM 1 day ago and was told he was fine. Pt was given albuterol with little relief at that time. He states he has tried not to exert himself so he can relax. He denies chest pain or discomfort. Pt also denies hx of emphysema ,COPD and asthma.  Pt also notes he has a itchy knot to his breastbone which he has had for several years. Pt denies having a PCP.   Past Medical History:  Diagnosis Date  . Borderline hypertension   . Environmental allergies   . Hypertension   . Sleep apnea     Patient Active Problem List   Diagnosis Date Noted  . Obstructive sleep apnea 03/19/2014    Past Surgical History:  Procedure Laterality Date  . SHOULDER SURGERY  1981       Home Medications    Prior to Admission medications   Not on File    Family History Family History  Problem Relation Age of Onset  . Lung cancer Mother   . Other Father     blood disease-ETOH abuse    Social History Social  History  Substance Use Topics  . Smoking status: Never Smoker  . Smokeless tobacco: Never Used  . Alcohol use No     Allergies   Codeine   Review of Systems Review of Systems  Respiratory: Positive for shortness of breath.   Cardiovascular: Negative for chest pain.  10 systems reviewed and all are negative for acute change except as noted in the HPI.  Physical Exam Updated Vital Signs BP 145/95 (BP Location: Left Arm)   Pulse 67   Temp 98.1 F (36.7 C) (Oral)   Resp 18   SpO2 100%   Physical Exam  Constitutional: He is oriented to person, place, and time. He appears well-developed and well-nourished.  HENT:  Head: Normocephalic and atraumatic.  Eyes: EOM are normal.  Neck: Normal range of motion.  Cardiovascular: Normal rate, regular rhythm, normal heart sounds and intact distal pulses.   Pulmonary/Chest: Effort normal and breath sounds normal. No respiratory distress.  Abdominal: Soft. He exhibits no distension. There is no tenderness.  Musculoskeletal: Normal range of motion.  Neurological: He is alert and oriented to person, place, and time.  Skin: Skin is warm and dry.  Psychiatric: He has a normal mood and affect. Judgment normal.  Anxious appearing   Nursing note and vitals reviewed.  ED Treatments / Results  DIAGNOSTIC STUDIES:  Oxygen Saturation is 100% on RA, normal by my interpretation.    COORDINATION OF CARE:  2:22 AM Discussed treatment plan with pt at bedside and pt agreed to plan.  Labs (all labs ordered are listed, but only abnormal results are displayed) Labs Reviewed  BASIC METABOLIC PANEL - Abnormal; Notable for the following:       Result Value   CO2 21 (*)    Glucose, Bld 428 (*)    All other components within normal limits  CBC - Abnormal; Notable for the following:    MCHC 36.4 (*)    All other components within normal limits  BRAIN NATRIURETIC PEPTIDE  HEMOGLOBIN A1C  I-STAT TROPOININ, ED  I-STAT TROPOININ, ED    EKG   EKG Interpretation  Date/Time:  Tuesday April 17 2016 19:30:42 EDT Ventricular Rate:  83 PR Interval:  158 QRS Duration: 94 QT Interval:  372 QTC Calculation: 437 R Axis:   39 Text Interpretation:  Normal sinus rhythm with sinus arrhythmia Incomplete right bundle branch block Borderline ECG No significant change was found Confirmed by Trinka Keshishyan  MD, Caryn BeeKEVIN ([id number]) on 04/18/2016 2:18:42 AM       Radiology Dg Chest 2 View  Result Date: 04/17/2016 CLINICAL DATA:  Shortness of breath EXAM: CHEST  2 VIEW COMPARISON:  04/17/2016 FINDINGS: The heart size and mediastinal contours are within normal limits. Both lungs are clear. The visualized skeletal structures are unremarkable. IMPRESSION: No active cardiopulmonary disease. Electronically Signed   By: Elige KoHetal  Patel   On: 04/17/2016 21:10   Dg Chest 2 View  Result Date: 04/17/2016 CLINICAL DATA:  Shortness of breath and productive cough EXAM: CHEST  2 VIEW COMPARISON:  01/28/2008 FINDINGS: Normal heart size and mediastinal contours. No acute infiltrate or edema. No effusion or pneumothorax. No acute osseous findings. IMPRESSION: Negative chest. Electronically Signed   By: Marnee SpringJonathon  Watts M.D.   On: 04/17/2016 07:18   Ct Angio Chest Pe W And/or Wo Contrast  Result Date: 04/18/2016 CLINICAL DATA:  Acute onset of shortness of breath and cough. Initial encounter. EXAM: CT ANGIOGRAPHY CHEST WITH CONTRAST TECHNIQUE: Multidetector CT imaging of the chest was performed using the standard protocol during bolus administration of intravenous contrast. Multiplanar CT image reconstructions and MIPs were obtained to evaluate the vascular anatomy. CONTRAST:  80 mL of Isovue 370 IV contrast COMPARISON:  Chest radiograph performed 04/17/2016 FINDINGS: Cardiovascular:  There is no evidence of pulmonary embolus. The heart is unremarkable in appearance. No calcific atherosclerotic disease is seen. The thoracic aorta is unremarkable in appearance. The great vessels are  within normal limits. Mediastinum/Nodes: The mediastinum is unremarkable in appearance. No mediastinal lymphadenopathy is seen. No pericardial effusion is identified. The visualized portions of the thyroid gland are unremarkable. No axillary lymphadenopathy is seen. Lungs/Pleura: The lungs are essentially clear bilaterally. No focal consolidation, pleural effusion or pneumothorax is seen. No masses are identified. Upper Abdomen: The visualized portions of the liver and spleen are unremarkable in appearance. The visualized portions of the pancreas are within normal limits. Musculoskeletal: No acute osseous abnormalities are identified. The visualized musculature is unremarkable in appearance. Review of the MIP images confirms the above findings. IMPRESSION: 1. No evidence of pulmonary embolus. 2. Lungs clear bilaterally. Electronically Signed   By: Roanna RaiderJeffery  Chang M.D.   On: 04/18/2016 04:12    Procedures Procedures (including critical care time)  Medications Ordered in ED Medications - No data to display   Initial Impression /  Assessment and Plan / ED Course  I have reviewed the triage vital signs and the nursing notes.  Pertinent labs & imaging results that were available during my care of the patient were reviewed by me and considered in my medical decision making (see chart for details).  Clinical Course    Patient is overall well-appearing.  Not sure what his intermittent shortness of breath is when he lays flat.  This does not appear to be congestive heart failure.  His CT angios of his chest is without abnormalities.  He is scheduled to see pulmonary later today.  I've asked that he follow-up with a pulmonologist.  He does seem rather anxious and I think anxiety may be playing a major role in all of this.  He was found to be hyperglycemic today with blood sugar in the 400s and I suspect this is new onset diabetes.  He'll be started on metformin.  Hemoglobin A1c sent for follow-up purposes.  He  will need a primary care physician.  Final Clinical Impressions(s) / ED Diagnoses   Final diagnoses:  Dyspnea  Hyperglycemia    New Prescriptions New Prescriptions   METFORMIN (GLUCOPHAGE) 500 MG TABLET    Take 1 tablet (500 mg total) by mouth 2 (two) times daily with a meal.     I personally performed the services described in this documentation, which was scribed in my presence. The recorded information has been reviewed and is accurate.        Azalia Bilis, MD 04/18/16 [phone]

## 2016-04-18 NOTE — Patient Instructions (Signed)
Order- DME [facility]- continue CPAP 10, mask of choice, humidifier, supplies, AirView     Dx OSA  Flu Vax  Order- lab- D dimer    Dx dyspnea  Order- Office spirometry    Dx dyspnea  [facility] please help establish with a primary physician

## 2016-04-18 NOTE — Assessment & Plan Note (Signed)
Recent onset dyspnea mainly of feeling anxious trying to lie supine at night but also noted with exertion. CT angiogram was negative for acute lung process. Cardiac enzymes were not concerning. This may be mostly anxiety. Plan-d-dimer, office spirometry

## 2016-04-18 NOTE — Progress Notes (Signed)
03/19/14- 53 yoM never smoker Former patient-[facility];had sleep study about 16 years ago, coming to re-establish. Says he is still using the same machine with no adjustment, all night, every night with no problems. No recent masks. Straps are worn out.  Bedtime 10-11PM, latency 5-10 minutes, waking 3-4 times before up 6-7AM. Weight up 10 lbs since dx. No ENT surgery, "borderline" HBP, some nasal congestion/ allergic rhinitis. Married, works for [employer]. Brother has OSA.  05/17/14- 53 yoM never smoker followed for OSA FOLLOW FOR:  OSA; discuss sleep study results;  current CPAP 55 years old, set at 15cm; uses every night 8 hours nightly NPSG 04/27/14- severe obstructive and central sleep apnea, AHI 69.6 per hour, C Pap 10, weight 239 pounds He qualifies to get a new machine. We discussed sleep hygiene made difficult because his wife is a night owl and her noise at night disturbed him  04/18/2016-55 year old male never smoker followed for OSA, complicated by DM 2, HBP CPAP 10/[facility] ED visit follow up-cough, SOB. Pt states he does not feel bad. Has been told he is type 2 diabetic. Pt needs help establishing with PCP. Download confirms excellent compliance and control with 100%/for our goal, AHI 1.2/hour. He says he is very comfortable with his CPAP, popping told him he snores. Sleep is being disturbed more by frequent nocturia now recognized as associated with his diabetes. He was seen in the ER initially for dyspnea noted when he felt short of breath after being up to bathroom and trying to lie back down. He has slept sitting in a chair or car seat the last 2 nights. He denies chest pain, palpitation, cough or phlegm. Not wheezing. Ankles.swelling CBC, BNP and troponin, CXR and CT angiogram of chest were unremarkable at ER. Weight is down 18 pounds from 2015   ROS-see HPI Constitutional:   No-   weight loss, night sweats, fevers, chills, fatigue, lassitude. HEENT:   No-  headaches, difficulty  swallowing, tooth/dental problems, sore throat,       No-  sneezing, itching, ear ache, nasal congestion, post nasal drip,  CV:  No-   chest pain, orthopnea, PND, swelling in lower extremities, anasarca,                                                      izziness, palpitations Resp: +  shortness of breath with exertion or at rest.              No-   productive cough,  No non-productive cough,  No- coughing up of blood.              No-   change in color of mucus.  No- wheezing.   Skin: No-   rash or lesions. GI:  No-   heartburn, indigestion, abdominal pain, nausea, vomiting, GU:  MS:  No-   joint pain or swelling.   Neuro-     nothing unusual Psych:  No- change in mood or affect. No depression or anxiety.  No memory loss.  OBJ- Physical Exam General- Alert, Oriented, Affect-appropriate, Distress- none acute, mild overweight Skin- rash-none, lesions- none, excoriation- none Lymphadenopathy- none Head- atraumatic            Eyes- Gross vision intact, PERRLA, conjunctivae and secretions clear  Ears- Hearing, canals-normal            Nose- Clear, no-Septal dev, mucus, polyps, erosion, perforation             Throat- Mallampati III , mucosa clear , drainage- none, tonsils- atrophic Neck- flexible , trachea midline, no stridor , thyroid nl, carotid no bruit Chest - symmetrical excursion , unlabored           Heart/CV- RRR , no murmur , no gallop  , no rub, nl s1 s2                           - JVD- none , edema- none, stasis changes- none, varices- none           Lung- clear to P&A, wheeze- none, cough- none , dullness-none, rub- none           Chest wall-  Abd-  Br/ Gen/ Rectal- Not done, not indicated Extrem- cyanosis- none, clubbing, none, atrophy- none, strength- nl Neuro- grossly intact to observation

## 2016-04-19 ENCOUNTER — Telehealth: Payer: Self-pay | Admitting: Internal Medicine

## 2016-04-19 LAB — HEMOGLOBIN A1C
HEMOGLOBIN A1C: 9.8 % — AB (ref 4.8–5.6)
Mean Plasma Glucose: 235 mg/dL

## 2016-04-19 NOTE — Telephone Encounter (Signed)
The last order that I see clearly has the setting listed, so LMTCB to discuss

## 2016-04-20 NOTE — Telephone Encounter (Signed)
Called spoke with Velna HatchetSheila with [facility]. She states she needs a pressure setting for the CPAP. I explained to her that the last order stated a CPAP pressure setting of 10. She states that the order placed on 04/18/16 will work and nothing further is needed. She voiced understanding and had no further questions.

## 2016-04-25 ENCOUNTER — Encounter: Payer: Self-pay | Admitting: Family Medicine

## 2016-04-25 ENCOUNTER — Ambulatory Visit (INDEPENDENT_AMBULATORY_CARE_PROVIDER_SITE_OTHER): Payer: Managed Care, Other (non HMO) | Admitting: Family Medicine

## 2016-04-25 VITALS — BP 140/90 | HR 76 | Wt 224.0 lb

## 2016-04-25 DIAGNOSIS — G4733 Obstructive sleep apnea (adult) (pediatric): Secondary | ICD-10-CM | POA: Diagnosis not present

## 2016-04-25 DIAGNOSIS — R030 Elevated blood-pressure reading, without diagnosis of hypertension: Secondary | ICD-10-CM | POA: Diagnosis not present

## 2016-04-25 DIAGNOSIS — E119 Type 2 diabetes mellitus without complications: Secondary | ICD-10-CM | POA: Insufficient documentation

## 2016-04-25 MED ORDER — ONETOUCH DELICA LANCETS FINE MISC: 0 refills | Status: DC

## 2016-04-25 MED ORDER — BLOOD GLUCOSE TEST VI STRP: ORAL_STRIP | 0 refills | Status: DC

## 2016-04-25 NOTE — Patient Instructions (Signed)
Call and schedule your eye exam.  The nutritionist will contact you to schedule and appointment.  Keep checking your blood sugar daily fasting and 2 hours after a meal (lunch or dinner). Keep a log of these and bring them in to your next appointment.  Schedule for a diabetes check in 3 months.   Schedule for a complete physical exam and fasting labs in the next few weeks.   Basic Carbohydrate Counting for Diabetes Mellitus Carbohydrate counting is a method for keeping track of the amount of carbohydrates you eat. Eating carbohydrates naturally increases the level of sugar (glucose) in your blood, so it is important for you to know the amount that is okay for you to have in every meal. Carbohydrate counting helps keep the level of glucose in your blood within normal limits. The amount of carbohydrates allowed is different for every person. A dietitian can help you calculate the amount that is right for you. Once you know the amount of carbohydrates you can have, you can count the carbohydrates in the foods you want to eat. Carbohydrates are found in the following foods:  Grains, such as breads and cereals.  Dried beans and soy products.  Starchy vegetables, such as potatoes, peas, and corn.  Fruit and fruit juices.  Milk and yogurt.  Sweets and snack foods, such as cake, cookies, candy, chips, soft drinks, and fruit drinks. CARBOHYDRATE COUNTING There are two ways to count the carbohydrates in your food. You can use either of the methods or a combination of both. Reading the "Nutrition Facts" on Packaged Food The "Nutrition Facts" is an area that is included on the labels of almost all packaged food and beverages in the [place]. It includes the serving size of that food or beverage and information about the nutrients in each serving of the food, including the grams (g) of carbohydrate per serving.  Decide the number of servings of this food or beverage that you will be able to eat or  drink. Multiply that number of servings by the number of grams of carbohydrate that is listed on the label for that serving. The total will be the amount of carbohydrates you will be having when you eat or drink this food or beverage. Learning Standard Serving Sizes of Food When you eat food that is not packaged or does not include "Nutrition Facts" on the label, you need to measure the servings in order to count the amount of carbohydrates.A serving of most carbohydrate-rich foods contains about 15 g of carbohydrates. The following list includes serving sizes of carbohydrate-rich foods that provide 15 g ofcarbohydrate per serving:   1 slice of bread (1 oz) or 1 six-inch tortilla.    of a hamburger bun or English muffin.  4-6 crackers.   cup unsweetened dry cereal.    cup hot cereal.   cup rice or pasta.    cup mashed potatoes or  of a large baked potato.  1 cup fresh fruit or one small piece of fruit.    cup canned or frozen fruit or fruit juice.  1 cup milk.   cup plain fat-free yogurt or yogurt sweetened with artificial sweeteners.   cup cooked dried beans or starchy vegetable, such as peas, corn, or potatoes.  Decide the number of standard-size servings that you will eat. Multiply that number of servings by 15 (the grams of carbohydrates in that serving). For example, if you eat 2 cups of strawberries, you will have eaten 2 servings and  30 g of carbohydrates (2 servings x 15 g = 30 g). For foods such as soups and casseroles, in which more than one food is mixed in, you will need to count the carbohydrates in each food that is included. EXAMPLE OF CARBOHYDRATE COUNTING Sample Dinner  3 oz chicken breast.   cup of brown rice.   cup of corn.  1 cup milk.   1 cup strawberries with sugar-free whipped topping.  Carbohydrate Calculation Step 1: Identify the foods that contain carbohydrates:   Rice.   Corn.   Milk.   Strawberries. Step 2:Calculate  the number of servings eaten of each:   2 servings of rice.   1 serving of corn.   1 serving of milk.   1 serving of strawberries. Step 3: Multiply each of those number of servings by 15 g:   2 servings of rice x 15 g = 30 g.   1 serving of corn x 15 g = 15 g.   1 serving of milk x 15 g = 15 g.   1 serving of strawberries x 15 g = 15 g. Step 4: Add together all of the amounts to find the total grams of carbohydrates eaten: 30 g + 15 g + 15 g + 15 g = 75 g.   This information is not intended to replace advice given to you by your health care provider. Make sure you discuss any questions you have with your health care provider.   Document Released: 07/09/2005 Document Revised: 07/30/2014 Document Reviewed: 06/05/2013 Elsevier Interactive Patient Education 2016 [employer].   Diabetes and Exercise Exercising regularly is important. It is not just about losing weight. It has many health benefits, such as:  Improving your overall fitness, flexibility, and endurance.  Increasing your bone density.  Helping with weight control.  Decreasing your body fat.  Increasing your muscle strength.  Reducing stress and tension.  Improving your overall health. People with diabetes who exercise gain additional benefits because exercise:  Reduces appetite.  Improves the body's use of blood sugar (glucose).  Helps lower or control blood glucose.  Decreases blood pressure.  Helps control blood lipids (such as cholesterol and triglycerides).  Improves the body's use of the hormone insulin by:  Increasing the body's insulin sensitivity.  Reducing the body's insulin needs.  Decreases the risk for heart disease because exercising:  Lowers cholesterol and triglycerides levels.  Increases the levels of good cholesterol (such as high-density lipoproteins [HDL]) in the body.  Lowers blood glucose levels. YOUR ACTIVITY PLAN  Choose an activity that you enjoy, and set  realistic goals. To exercise safely, you should begin practicing any new physical activity slowly, and gradually increase the intensity of the exercise over time. Your health care provider or diabetes educator can help create an activity plan that works for you. General recommendations include:  Encouraging children to engage in at least 60 minutes of physical activity each day.  Stretching and performing strength training exercises, such as yoga or weight lifting, at least 2 times per week.  Performing a total of at least 150 minutes of moderate-intensity exercise each week, such as brisk walking or water aerobics.  Exercising at least 3 days per week, making sure you allow no more than 2 consecutive days to pass without exercising.  Avoiding long periods of inactivity (90 minutes or more). When you have to spend an extended period of time sitting down, take frequent breaks to walk or stretch. RECOMMENDATIONS FOR EXERCISING WITH  TYPE 1 OR TYPE 2 DIABETES   Check your blood glucose before exercising. If blood glucose levels are greater than 240 mg/dL, check for urine ketones. Do not exercise if ketones are present.  Avoid injecting insulin into areas of the body that are going to be exercised. For example, avoid injecting insulin into:  The arms when playing tennis.  The legs when jogging.  Keep a record of:  Food intake before and after you exercise.  Expected peak times of insulin action.  Blood glucose levels before and after you exercise.  The type and amount of exercise you have done.  Review your records with your health care provider. Your health care provider will help you to develop guidelines for adjusting food intake and insulin amounts before and after exercising.  If you take insulin or oral hypoglycemic agents, watch for signs and symptoms of hypoglycemia. They include:  Dizziness.  Shaking.  Sweating.  Chills.  Confusion.  Drink plenty of water while you  exercise to prevent dehydration or heat stroke. Body water is lost during exercise and must be replaced.  Talk to your health care provider before starting an exercise program to make sure it is safe for you. Remember, almost any type of activity is better than none.   This information is not intended to replace advice given to you by your health care provider. Make sure you discuss any questions you have with your health care provider.   Document Released: 09/29/2003 Document Revised: 11/23/2014 Document Reviewed: 12/16/2012 Elsevier Interactive Patient Education 2016 [employer] Inc[employer]  Blood Glucose Monitoring, Adult Monitoring your blood glucose (also know as blood sugar) helps you to manage your diabetes. It also helps you and your health care provider monitor your diabetes and determine how well your treatment plan is working. WHY SHOULD YOU MONITOR YOUR BLOOD GLUCOSE?  It can help you understand how food, exercise, and medicine affect your blood glucose.  It allows you to know what your blood glucose is at any given moment. You can quickly tell if you are having low blood glucose (hypoglycemia) or high blood glucose (hyperglycemia).  It can help you and your health care provider know how to adjust your medicines.  It can help you understand how to manage an illness or adjust medicine for exercise. WHEN SHOULD YOU TEST? Your health care provider will help you decide how often you should check your blood glucose. This may depend on the type of diabetes you have, your diabetes control, or the types of medicines you are taking. Be sure to write down all of your blood glucose readings so that this information can be reviewed with your health care provider. See below for examples of testing times that your health care provider may suggest. Type 1 Diabetes  Test at least 2 times per day if your diabetes is well controlled, if you are using an insulin pump, or if you perform multiple daily  injections.  If your diabetes is not well controlled or if you are sick, you may need to test more often.  It is a good idea to also test:  Before every insulin injection.  Before and after exercise.  Between meals and 2 hours after a meal.  Occasionally between 2:00 a.m. and 3:00 a.m. Type 2 Diabetes  If you are taking insulin, test at least 2 times per day. However, it is best to test before every insulin injection.  If you take medicines by mouth (orally), test 2 times a day.  If you are on a controlled diet, test once a day.  If your diabetes is not well controlled or if you are sick, you may need to monitor more often. HOW TO MONITOR YOUR BLOOD GLUCOSE Supplies Needed  Blood glucose meter.  Test strips for your meter. Each meter has its own strips. You must use the strips that go with your own meter.  A pricking needle (lancet).  A device that holds the lancet (lancing device).  A journal or log book to write down your results. Procedure  Wash your hands with soap and water. Alcohol is not preferred.  Prick the side of your finger (not the tip) with the lancet.  Gently milk the finger until a small drop of blood appears.  Follow the instructions that come with your meter for inserting the test strip, applying blood to the strip, and using your blood glucose meter. Other Areas to Get Blood for Testing Some meters allow you to use other areas of your body (other than your finger) to test your blood. These areas are called alternative sites. The most common alternative sites are:  The forearm.  The thigh.  The back area of the lower leg.  The palm of the hand. The blood flow in these areas is slower. Therefore, the blood glucose values you get may be delayed, and the numbers are different from what you would get from your fingers. Do not use alternative sites if you think you are having hypoglycemia. Your reading will not be accurate. Always use a finger if you  are having hypoglycemia. Also, if you cannot feel your lows (hypoglycemia unawareness), always use your fingers for your blood glucose checks. ADDITIONAL TIPS FOR GLUCOSE MONITORING  Do not reuse lancets.  Always carry your supplies with you.  All blood glucose meters have a 24-hour "hotline" number to call if you have questions or need help.  Adjust (calibrate) your blood glucose meter with a control solution after finishing a few boxes of strips. BLOOD GLUCOSE RECORD KEEPING It is a good idea to keep a daily record or log of your blood glucose readings. Most glucose meters, if not all, keep your glucose records stored in the meter. Some meters come with the ability to download your records to your home computer. Keeping a record of your blood glucose readings is especially helpful if you are wanting to look for patterns. Make notes to go along with the blood glucose readings because you might forget what happened at that exact time. Keeping good records helps you and your health care provider to work together to achieve good diabetes management.    This information is not intended to replace advice given to you by your health care provider. Make sure you discuss any questions you have with your health care provider.   Document Released: 07/12/2003 Document Revised: 07/30/2014 Document Reviewed: 12/01/2012 Elsevier Interactive Patient Education [employer]

## 2016-04-25 NOTE — Progress Notes (Signed)
Subjective:    Patient ID: Pedro Moyer, male    DOB: 26-Mar-1961, 55 y.o.   MRN: [id number]  HPI Chief Complaint  Patient presents with  . new pt    new pt, get established. diabetes   He is 55 year old male with a history of elevated blood pressure but no diagnosis of HTN,  OSA, PNA who is new to the practice and here to establish care. He was seen in the ED recently for shortness of breath and referred back to pulmonologist. He had a cardiac workup in the ED with negative CTA, CXR, ECG, CBC, BNP, and troponin.  His blood sugar in the ED was in the 400s and he was started on Metformin.  His hemoglobin A1c was 9.8%.  He has not had a PCP in several years. Dr. Nigel MormonEingheir at [facility] was his previous PCP.    He is currently using a CPAP for OSA and settings are up to date per Dr. Roxy CedarYoung's note, his spirometry was normal per record.   PMH : Diabetes diagnosed last week in the ED.   And has been taking twice daily Metformin without any issues.    Checking his blood sugars at home since last week.  2 hours after eating it was 177. Fasting 143.   Walks while at work but no other exercise.   Has had some tingling in his fingers and feet for a couple of years but states this has resolved over the past week.    Surgeries: shoulder surgery  Family history: mother and brothers with diabetes.    Other providers: Dr. Maple HudsonYoung pulmonologist.   Denies smoking, alcohol use or drug use.   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  Married- separated. 5 kids.   Reviewed allergies, medications, past medical, surgical, family, and social history.    Review of Systems Review of Systems Constitutional: -fever, -chills, -sweats, -unexpected weight change,-fatigue ENT: -runny nose, -ear pain, -sore throat Cardiology:  -chest pain, -palpitations, -edema Respiratory: -cough, -shortness of breath, -wheezing Gastroenterology: -abdominal pain, -nausea, -vomiting,  -diarrhea, -constipation  Hematology: -bleeding or bruising problems Musculoskeletal: -arthralgias, -myalgias, -joint swelling, -back pain Ophthalmology: -vision changes Urology: -dysuria, -difficulty urinating, -hematuria, -urinary frequency, -urgency Neurology: -headache, -weakness, -tingling, -numbness        Objective:   Physical Exam BP 140/90   Pulse 76   Wt 224 lb (101.6 kg)   BMI 31.24 kg/m  Alert and in no distress.  Pharyngeal area is normal. Neck is supple without adenopathy or thyromegaly. Cardiac exam shows a regular sinus rhythm without murmurs or gallops. Lungs are clear to auscultation. Extremities without edema, normal pulses.       Assessment & Plan:  Newly diagnosed diabetes (HCC) - Plan: Amb ref to Medical Nutrition Therapy-MNT  Obstructive sleep apnea  Blood pressure elevated without history of HTN  Counseling on diabetes and the spectrum of the illness. Discussed lifestyle modifications such as carb counting, avoiding white foods such as sugar, rice, etc. He will check his daily blood sugar fasting and 2 hours after a meal. He will keep a log and bring this in for a 3 months diabetes check. Continue on Metformin for now. He appears to be doing fine on this. Referral made to the medical nutritionist.  ED and pulmonologist records were reviewed. He will continue using CPAP and following up with pulmonologist as scheduled.  He had a cardiac cath in May 2012 that  showed a normal LV systolic function and no evidence of CAD.  Needs eye exam and he will call and schedule this.  He is overdue for CPE and fasting labs. Has never had a colonoscopy and will need to get him up to date on preventive health.

## 2016-05-01 ENCOUNTER — Encounter: Payer: Self-pay | Admitting: Internal Medicine

## 2016-05-09 ENCOUNTER — Ambulatory Visit (INDEPENDENT_AMBULATORY_CARE_PROVIDER_SITE_OTHER): Payer: Managed Care, Other (non HMO) | Admitting: Family Medicine

## 2016-05-09 ENCOUNTER — Encounter: Payer: Self-pay | Admitting: Family Medicine

## 2016-05-09 VITALS — BP 132/82 | HR 66 | Ht 71.2 in | Wt 222.2 lb

## 2016-05-09 DIAGNOSIS — Z0000 Encounter for general adult medical examination without abnormal findings: Secondary | ICD-10-CM

## 2016-05-09 DIAGNOSIS — Z23 Encounter for immunization: Secondary | ICD-10-CM | POA: Diagnosis not present

## 2016-05-09 DIAGNOSIS — E1165 Type 2 diabetes mellitus with hyperglycemia: Secondary | ICD-10-CM | POA: Insufficient documentation

## 2016-05-09 DIAGNOSIS — E119 Type 2 diabetes mellitus without complications: Secondary | ICD-10-CM

## 2016-05-09 DIAGNOSIS — Z125 Encounter for screening for malignant neoplasm of prostate: Secondary | ICD-10-CM

## 2016-05-09 DIAGNOSIS — Z1159 Encounter for screening for other viral diseases: Secondary | ICD-10-CM

## 2016-05-09 DIAGNOSIS — Z1211 Encounter for screening for malignant neoplasm of colon: Secondary | ICD-10-CM | POA: Diagnosis not present

## 2016-05-09 DIAGNOSIS — IMO0001 Reserved for inherently not codable concepts without codable children: Secondary | ICD-10-CM

## 2016-05-09 HISTORY — DX: Type 2 diabetes mellitus without complications: E11.9

## 2016-05-09 LAB — POCT URINALYSIS DIPSTICK
BILIRUBIN UA: NEGATIVE
Blood, UA: NEGATIVE
GLUCOSE UA: NEGATIVE
KETONES UA: NEGATIVE
LEUKOCYTES UA: NEGATIVE
Nitrite, UA: NEGATIVE
Protein, UA: NEGATIVE
Spec Grav, UA: 1.025
Urobilinogen, UA: 1
pH, UA: 6

## 2016-05-09 LAB — CBC WITH DIFFERENTIAL/PLATELET
BASOS PCT: 1 %
Basophils Absolute: 56 cells/uL (ref 0–200)
Eosinophils Absolute: 336 cells/uL (ref 15–500)
Eosinophils Relative: 6 %
HEMATOCRIT: 43.7 % (ref 38.5–50.0)
HEMOGLOBIN: 15.8 g/dL (ref 13.2–17.1)
LYMPHS ABS: 1624 {cells}/uL (ref 850–3900)
Lymphocytes Relative: 29 %
MCH: 29.6 pg (ref 27.0–33.0)
MCHC: 36.2 g/dL — ABNORMAL HIGH (ref 32.0–36.0)
MCV: 82 fL (ref 80.0–100.0)
MONO ABS: 448 {cells}/uL (ref 200–950)
MPV: 10.4 fL (ref 7.5–12.5)
Monocytes Relative: 8 %
NEUTROS ABS: 3136 {cells}/uL (ref 1500–7800)
Neutrophils Relative %: 56 %
Platelets: 250 10*3/uL (ref 140–400)
RBC: 5.33 MIL/uL (ref 4.20–5.80)
RDW: 13.3 % (ref 11.0–15.0)
WBC: 5.6 10*3/uL (ref 4.0–10.5)

## 2016-05-09 LAB — COMPLETE METABOLIC PANEL WITH GFR
ALBUMIN: 4.6 g/dL (ref 3.6–5.1)
ALT: 38 U/L (ref 9–46)
AST: 28 U/L (ref 10–35)
Alkaline Phosphatase: 71 U/L (ref 40–115)
BUN: 14 mg/dL (ref 7–25)
CALCIUM: 9.6 mg/dL (ref 8.6–10.3)
CHLORIDE: 105 mmol/L (ref 98–110)
CO2: 23 mmol/L (ref 20–31)
CREATININE: 0.89 mg/dL (ref 0.70–1.33)
GFR, Est African American: >89 mL/min (ref >=60)
GFR, Est Non African American: >89 mL/min (ref >=60)
Glucose, Bld: 127 mg/dL — ABNORMAL HIGH (ref 65–99)
Potassium: 4.2 mmol/L (ref 3.5–5.3)
Sodium: 139 mmol/L (ref 135–146)
Total Bilirubin: 1.5 mg/dL — ABNORMAL HIGH (ref 0.2–1.2)
Total Protein: 7.6 g/dL (ref 6.1–8.1)

## 2016-05-09 LAB — PSA: PSA: 1.4 ng/mL (ref <=4.0)

## 2016-05-09 LAB — TSH: TSH: 0.9 m[IU]/L (ref 0.40–4.50)

## 2016-05-09 LAB — LIPID PANEL
CHOL/HDL RATIO: 2.5 ratio (ref <=5.0)
CHOLESTEROL: 97 mg/dL — AB (ref 125–200)
HDL: 39 mg/dL — AB (ref >=40)
LDL Cholesterol: 50 mg/dL (ref <130)
Triglycerides: 41 mg/dL (ref <150)
VLDL: 8 mg/dL (ref <30)

## 2016-05-09 LAB — HEPATITIS C ANTIBODY: HCV AB: NEGATIVE

## 2016-05-09 NOTE — Patient Instructions (Addendum)
Try to get at least 150 minutes of some form of exercise per week.  The GI office will call you to schedule an appointment to discuss colonoscopy.  We will call you with lab results.  Follow up for diabetes check as scheduled.   Preventative Care for Adults, Male       REGULAR HEALTH EXAMS:  A routine yearly physical is a good way to check in with your primary care provider about your health and preventive screening. It is also an opportunity to share updates about your health and any concerns you have, and receive a thorough all-over exam.   Most health insurance companies pay for at least some preventative services.  Check with your health plan for specific coverages.  WHAT PREVENTATIVE SERVICES DO MEN NEED?  Adult men should have their weight and blood pressure checked regularly.   Men age 56 and older should have their cholesterol levels checked regularly.  Beginning at age 56 and continuing to age 87, men should be screened for colorectal cancer.  Certain people should may need continued testing until age 56.  Other cancer screening may include exams for testicular and prostate cancer.  Updating vaccinations is part of preventative care.  Vaccinations help protect against diseases such as the flu.  Lab tests are generally done as part of preventative care to screen for anemia and blood disorders, to screen for problems with the kidneys and liver, to screen for bladder problems, to check blood sugar, and to check your cholesterol level.  Preventative services generally include counseling about diet, exercise, avoiding tobacco, drugs, excessive alcohol consumption, and sexually transmitted infections.    GENERAL RECOMMENDATIONS FOR GOOD HEALTH:  Healthy diet:  Eat a variety of foods, including fruit, vegetables, animal or vegetable protein, such as meat, fish, chicken, and eggs, or beans, lentils, tofu, and grains, such as rice.  Drink plenty of water daily.  Decrease  saturated fat in the diet, avoid lots of red meat, processed foods, sweets, fast foods, and fried foods.  Exercise:  Aerobic exercise helps maintain good heart health. At least 30-40 minutes of moderate-intensity exercise is recommended. For example, a brisk walk that increases your heart rate and breathing. This should be done on most days of the week.   Find a type of exercise or a variety of exercises that you enjoy so that it becomes a part of your daily life.  Examples are running, walking, swimming, water aerobics, and biking.  For motivation and support, explore group exercise such as aerobic class, spin class, Zumba, Yoga,or  martial arts, etc.    Set exercise goals for yourself, such as a certain weight goal, walk or run in a race such as a 5k walk/run.  Speak to your primary care provider about exercise goals.  Disease prevention:  If you smoke or chew tobacco, find out from your caregiver how to quit. It can literally save your life, no matter how long you have been a tobacco user. If you do not use tobacco, never begin.   Maintain a healthy diet and normal weight. Increased weight leads to problems with blood pressure and diabetes.   The Body Mass Index or BMI is a way of measuring how much of your body is fat. Having a BMI above 27 increases the risk of heart disease, diabetes, hypertension, stroke and other problems related to obesity. Your caregiver can help determine your BMI and based on it develop an exercise and dietary program to help you achieve or  maintain this important measurement at a healthful level.  High blood pressure causes heart and blood vessel problems.  Persistent high blood pressure should be treated with medicine if weight loss and exercise do not work.   Fat and cholesterol leaves deposits in your arteries that can block them. This causes heart disease and vessel disease elsewhere in your body.  If your cholesterol is found to be high, or if you have heart  disease or certain other medical conditions, then you may need to have your cholesterol monitored frequently and be treated with medication.   Ask if you should have a stress test if your history suggests this. A stress test is a test done on a treadmill that looks for heart disease. This test can find disease prior to there being a problem.  Avoid drinking alcohol in excess (more than two drinks per day).  Avoid use of street drugs. Do not share needles with anyone. Ask for professional help if you need assistance or instructions on stopping the use of alcohol, cigarettes, and/or drugs.  Brush your teeth twice a day with fluoride toothpaste, and floss once a day. Good oral hygiene prevents tooth decay and gum disease. The problems can be painful, unattractive, and can cause other health problems. Visit your dentist for a routine oral and dental check up and preventive care every 6-12 months.   Look at your skin regularly.  Use a mirror to look at your back. Notify your caregivers of changes in moles, especially if there are changes in shapes, colors, a size larger than a pencil eraser, an irregular border, or development of new moles.  Safety:  Use seatbelts 100% of the time, whether driving or as a passenger.  Use safety devices such as hearing protection if you work in environments with loud noise or significant background noise.  Use safety glasses when doing any work that could send debris in to the eyes.  Use a helmet if you ride a bike or motorcycle.  Use appropriate safety gear for contact sports.  Talk to your caregiver about gun safety.  Use sunscreen with a SPF (or skin protection factor) of 15 or greater.  Lighter skinned people are at a greater risk of skin cancer. Don't forget to also wear sunglasses in order to protect your eyes from too much damaging sunlight. Damaging sunlight can accelerate cataract formation.   Practice safe sex. Use condoms. Condoms are used for birth control and  to help reduce the spread of sexually transmitted infections (or STIs).  Some of the STIs are gonorrhea (the clap), chlamydia, syphilis, trichomonas, herpes, HPV (human papilloma virus) and HIV (human immunodeficiency virus) which causes AIDS. The herpes, HIV and HPV are viral illnesses that have no cure. These can result in disability, cancer and death.   Keep carbon monoxide and smoke detectors in your home functioning at all times. Change the batteries every 6 months or use a model that plugs into the wall.   Vaccinations:  Stay up to date with your tetanus shots and other required immunizations. You should have a booster for tetanus every 10 years. Be sure to get your flu shot every year, since 5%-20% of the [place] population comes down with the flu. The flu vaccine changes each year, so being vaccinated once is not enough. Get your shot in the fall, before the flu season peaks.   Other vaccines to consider:  Pneumococcal vaccine to protect against certain types of pneumonia.  This is normally recommended for  adults age 56 or older.  However, adults younger than 56 years old with certain underlying conditions such as diabetes, heart or lung disease should also receive the vaccine.  Shingles vaccine to protect against Varicella Zoster if you are older than age 56, or younger than 56 years old with certain underlying illness.  Hepatitis A vaccine to protect against a form of infection of the liver by a virus acquired from food.  Hepatitis B vaccine to protect against a form of infection of the liver by a virus acquired from blood or body fluids, particularly if you work in health care.  If you plan to travel internationally, check with your local health department for specific vaccination recommendations.  Cancer Screening:  Most routine colon cancer screening begins at the age of 56. On a yearly basis, doctors may provide special easy to use take-home tests to check for hidden blood in the  stool. Sigmoidoscopy or colonoscopy can detect the earliest forms of colon cancer and is life saving. These tests use a small camera at the end of a tube to directly examine the colon. Speak to your caregiver about this at age 56, when routine screening begins (and is repeated every 5 years unless early forms of pre-cancerous polyps or small growths are found).   At the age of 350 men usually start screening for prostate cancer every year. Screening may begin at a younger age for those with higher risk. Those at higher risk include African-Americans or having a family history of prostate cancer. There are two types of tests for prostate cancer:   Prostate-specific antigen (PSA) testing. Recent studies raise questions about prostate cancer using PSA and you should discuss this with your caregiver.   Digital rectal exam (in which your doctor's lubricated and gloved finger feels for enlargement of the prostate through the anus).   Screening for testicular cancer.  Do a monthly exam of your testicles. Gently roll each testicle between your thumb and fingers, feeling for any abnormal lumps. The best time to do this is after a hot shower or bath when the tissues are looser. Notify your caregivers of any lumps, tenderness or changes in size or shape immediately.

## 2016-05-09 NOTE — Progress Notes (Signed)
Subjective:    Patient ID: Pedro Moyer, male    DOB: 02/02/1961, 55 y.o.   MRN: [id number]  HPI Chief Complaint  Patient presents with  . fasting cpe    fasting cpe- having trouble sleeping.    He is 55 year old male with a history of elevated blood pressure but no diagnosis of HTN,  OSA, PNA who is fairly new to the practice and here for a CPE.  He was seen in the ED recently for shortness of breath and referred back to pulmonologist. He had a cardiac workup in the ED with negative CTA, CXR, ECG, CBC, BNP, and troponin.   His blood sugar in the ED was in the 400s and he was started on Metformin.  His hemoglobin A1c was 9.8%.  Average daily is 142. Fasting ranges from 83-119.   He has not had a PCP in several years.  Dr. Nigel MormonEingheir at [facility] was his previous PCP.    He is currently using a CPAP for OSA and settings are up to date per Dr. Roxy CedarYoung's note, his spirometry was normal per record.   PMH : Diabetes diagnosed last week in the ED.   And has been taking twice daily Metformin without any issues.    Checking his blood sugars at home since last week.   Walks while at work but no other exercise.   Family history: mother and brothers with diabetes.    Other providers: Dr. Maple HudsonYoung pulmonologist.   Denies smoking, alcohol use or drug use.  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  Married- separated. 5 kids.   Immunizations: Tdap unknown   Health maintenance:  Colonoscopy: never  Last PSA: never  Last Dental Exam: twice annually - oral surgeon 2 months ago for wisdom teeth removed.  Last Eye Exam: 2 years ago will schedule this   Wears seatbelt always, uses sunscreen, smoke detectors in home and functioning, does not text while driving, feels safe in home environment.  Reviewed allergies, medications, past medical, surgical, family, and social history.   Review of Systems Review of Systems Constitutional: -fever, -chills, -sweats, -unexpected weight  change,-fatigue ENT: -runny nose, -ear pain, -sore throat Cardiology:  -chest pain, -palpitations, -edema Respiratory: -cough, -shortness of breath, -wheezing Gastroenterology: -abdominal pain, -nausea, -vomiting, -diarrhea, -constipation  Hematology: -bleeding or bruising problems Musculoskeletal: -arthralgias, -myalgias, -joint swelling, -back pain Ophthalmology: -vision changes Urology: -dysuria, -difficulty urinating, -hematuria, -urinary frequency, -urgency Neurology: -headache, -weakness, -tingling, -numbness       Objective:   Physical Exam BP 132/82   Pulse 66   Ht 5' 11.2" (1.808 m)   Wt 222 lb 3.2 oz (100.8 kg)   BMI 30.82 kg/m   General Appearance:    Alert, cooperative, no distress, appears stated age  Head:    Normocephalic, without obvious abnormality, atraumatic  Eyes:    PERRL, conjunctiva/corneas clear, EOM's intact, fundi    benign  Ears:    Normal TM's and external ear canals  Nose:   Nares normal, mucosa normal, no drainage or sinus   tenderness  Throat:   Lips, mucosa, and tongue normal; teeth and gums normal  Neck:   Supple, no lymphadenopathy;  thyroid:  no   enlargement/tenderness/nodules; no carotid   bruit or JVD  Back:    Spine nontender, no curvature, ROM normal, no CVA     tenderness  Lungs:     Clear to auscultation bilaterally without wheezes, rales or     ronchi; respirations  unlabored  Chest Wall:    No tenderness or deformity   Heart:    Regular rate and rhythm, S1 and S2 normal, no murmur, rub   or gallop  Breast Exam:    No chest wall tenderness, masses or gynecomastia  Abdomen:     Soft, non-tender, nondistended, normoactive bowel sounds,    no masses, no hepatosplenomegaly  Genitalia:    Normal male external genitalia without lesions. Testicles without masses.  No inguinal hernias.  Rectal:    Declined. Referred to GI.   Extremities:   No clubbing, cyanosis or edema  Pulses:   2+ and symmetric all extremities  Skin:   Skin color,  texture, turgor normal, no rashes or lesions  Lymph nodes:   Cervical, supraclavicular, and axillary nodes normal  Neurologic:   CNII-XII intact, normal strength, sensation and gait; reflexes 2+ and symmetric throughout          Psych:   Normal mood, affect, hygiene and grooming.    Urinalysis dipstick: uro      Assessment & Plan:  Routine general medical examination at a health care facility - Plan: CBC with Differential/Platelet, COMPLETE METABOLIC PANEL WITH GFR, TSH, Lipid panel, Urinalysis Dipstick  Uncontrolled type 2 diabetes mellitus without complication, without long-term current use of insulin (HCC) - Plan: CBC with Differential/Platelet, COMPLETE METABOLIC PANEL WITH GFR, Microalbumin / creatinine urine ratio, TSH  Special screening for malignant neoplasms, colon - Plan: Ambulatory referral to Gastroenterology  Need for hepatitis C screening test - Plan: Hepatitis C antibody  Screening for prostate cancer - Plan: PSA  Need for Tdap vaccination - Plan: Tdap vaccine greater than or equal to 7yo IM  Overall he appears to be healthy and doing well with his new diabetes diagnosis.  Recommend that he start being more physically active for diabetes and overall health.  Discussed safety and health promotion.  One time Hep C test performed.  Tdap given  Referral made to GI for colonoscopy.  He will get an eye exam in the next few months.  microalbumin ordered.  Follow up pending labs.

## 2016-05-10 ENCOUNTER — Encounter: Payer: Self-pay | Admitting: Internal Medicine

## 2016-05-10 LAB — MICROALBUMIN / CREATININE URINE RATIO
CREATININE, URINE: 73 mg/dL (ref 20–370)
Microalb Creat Ratio: 10 mcg/mg creat (ref <30)
Microalb, Ur: 0.7 mg/dL

## 2016-05-14 ENCOUNTER — Ambulatory Visit: Payer: Managed Care, Other (non HMO) | Admitting: Nurse Practitioner

## 2016-05-20 ENCOUNTER — Other Ambulatory Visit: Payer: Self-pay | Admitting: Family Medicine

## 2016-05-20 MED ORDER — METFORMIN HCL 500 MG PO TABS: 500.0000 mg | ORAL_TABLET | Freq: Two times a day (BID) | ORAL | 2 refills | Status: DC

## 2016-06-11 ENCOUNTER — Encounter: Payer: Self-pay | Admitting: Family Medicine

## 2016-07-20 ENCOUNTER — Ambulatory Visit: Payer: Managed Care, Other (non HMO) | Admitting: Internal Medicine

## 2016-07-26 ENCOUNTER — Other Ambulatory Visit: Payer: Self-pay | Admitting: Family Medicine

## 2016-08-01 ENCOUNTER — Ambulatory Visit: Payer: Managed Care, Other (non HMO) | Admitting: Family Medicine

## 2016-08-03 ENCOUNTER — Encounter: Payer: Self-pay | Admitting: Family Medicine

## 2016-09-06 ENCOUNTER — Other Ambulatory Visit: Payer: Self-pay | Admitting: Family Medicine

## 2016-09-21 ENCOUNTER — Encounter: Payer: Self-pay | Admitting: Family Medicine

## 2016-09-21 ENCOUNTER — Ambulatory Visit (INDEPENDENT_AMBULATORY_CARE_PROVIDER_SITE_OTHER): Payer: BLUE CROSS/BLUE SHIELD | Admitting: Family Medicine

## 2016-09-21 VITALS — BP 160/90 | HR 75 | Resp 16 | Wt 226.6 lb

## 2016-09-21 DIAGNOSIS — F439 Reaction to severe stress, unspecified: Secondary | ICD-10-CM | POA: Diagnosis not present

## 2016-09-21 DIAGNOSIS — R202 Paresthesia of skin: Secondary | ICD-10-CM | POA: Diagnosis not present

## 2016-09-21 DIAGNOSIS — E119 Type 2 diabetes mellitus without complications: Secondary | ICD-10-CM | POA: Diagnosis not present

## 2016-09-21 DIAGNOSIS — R030 Elevated blood-pressure reading, without diagnosis of hypertension: Secondary | ICD-10-CM | POA: Diagnosis not present

## 2016-09-21 LAB — CBC WITH DIFFERENTIAL/PLATELET
BASOS PCT: 0 %
Basophils Absolute: 0 cells/uL (ref 0–200)
EOS ABS: 207 {cells}/uL (ref 15–500)
Eosinophils Relative: 3 %
HCT: 45.6 % (ref 38.5–50.0)
Hemoglobin: 15.9 g/dL (ref 13.2–17.1)
LYMPHS PCT: 26 %
Lymphs Abs: 1794 cells/uL (ref 850–3900)
MCH: 28.5 pg (ref 27.0–33.0)
MCHC: 34.9 g/dL (ref 32.0–36.0)
MCV: 81.7 fL (ref 80.0–100.0)
MONOS PCT: 8 %
MPV: 9.9 fL (ref 7.5–12.5)
Monocytes Absolute: 552 cells/uL (ref 200–950)
NEUTROS ABS: 4347 {cells}/uL (ref 1500–7800)
Neutrophils Relative %: 63 %
PLATELETS: 263 10*3/uL (ref 140–400)
RBC: 5.58 MIL/uL (ref 4.20–5.80)
RDW: 13.7 % (ref 11.0–15.0)
WBC: 6.9 10*3/uL (ref 4.0–10.5)

## 2016-09-21 LAB — COMPREHENSIVE METABOLIC PANEL
ALK PHOS: 62 U/L (ref 40–115)
ALT: 24 U/L (ref 9–46)
AST: 17 U/L (ref 10–35)
Albumin: 4.5 g/dL (ref 3.6–5.1)
BILIRUBIN TOTAL: 0.9 mg/dL (ref 0.2–1.2)
BUN: 13 mg/dL (ref 7–25)
CO2: 22 mmol/L (ref 20–31)
CREATININE: 0.94 mg/dL (ref 0.70–1.33)
Calcium: 9.8 mg/dL (ref 8.6–10.3)
Chloride: 104 mmol/L (ref 98–110)
GLUCOSE: 141 mg/dL — AB (ref 65–99)
Potassium: 4.4 mmol/L (ref 3.5–5.3)
SODIUM: 140 mmol/L (ref 135–146)
TOTAL PROTEIN: 7.6 g/dL (ref 6.1–8.1)

## 2016-09-21 LAB — TSH: TSH: 1.45 m[IU]/L (ref 0.40–4.50)

## 2016-09-21 LAB — VITAMIN B12: Vitamin B-12: 447 pg/mL (ref 200–1100)

## 2016-09-21 LAB — POCT GLYCOSYLATED HEMOGLOBIN (HGB A1C)

## 2016-09-21 LAB — GLUCOSE, POCT (MANUAL RESULT ENTRY): POC Glucose: 164 mg/dl — AB (ref 70–99)

## 2016-09-21 NOTE — Patient Instructions (Addendum)
Check your blood pressures and blood pressures.  BP goals are <130/80 Fasting blood sugar goal is 90-130 and 2 hours after a meal goal is 130-180.   Cut back to metformin once daily for now. Let me know what your blood sugars are looking like.   Make sure you are eating small regular meals and stay hydrated. Get regular sleep.   Follow up in 2 weeks.

## 2016-09-21 NOTE — Progress Notes (Signed)
Subjective:    Patient ID: Pedro Moyer, male    DOB: 1960/11/21, 56 y.o.   MRN: [id number]  Pedro Moyer is a 55 y.o. male who presents for follow-up of Type 2 diabetes mellitus and for multiple complaints.  In October 2017 his A1c was 9.8%. He has not followed up as recommended. His diabetes diagnosis is still a fairly new diagnosis for him and this is his third visit to our practice for diabetes.   He is here with complaints of feeling "weird" this morning. States he did not feel dizzy but questions feeling "lightheaded" for a few seconds.  States he ate breakfast, took his metformin and took a nap then checked his blood sugar 3 hours after eating today and it was 271. States blood sugars are not usually that high. He was concerned so he wanted to come in. He felt better after waking up. States he feels back to his norm. States he has not been sleeping much this week due to long work hours and is under a great deal of stress due to work, his living situation and his sister passed away 3 weeks ago.   States he has had similar episodes of feeling like this in the past and it was most likely due to anxiety. He denies every being diagnosed with anxiety and has never taken medication for this. He has not seen a counselor but states he talks to his pastor a lot. States his faith gets him through stressful times.   Complains of ongoing burning sensation to his feet and finger tips. States this is intermittent and unchanged for the past several years.   He is aware his blood pressure is elevated today. States when he is stressed it usually is high. Denies history of HTN but has had some random elevated BPs in the past.   Denies fever, chills, headache, dizziness, tinnitus, vision changes, chest pain, palpitations, shortness of breath, orthopnea, abdominal pain, N/V/D, urinary symptoms.   Patient is checking home blood sugars.   Home blood sugar records: lowest 95 November 8th- highest 289 today  09/21/17 but the majority of fasting blood sugars have been <120 per patient. He did not bring in readings today.  How often is blood sugars being checked: checking them when he thinks of it. No set times.  Current symptoms include: feeling weird. Patient denies nausea, visual disturbances, vomiting and weight loss.  Patient is checking their feet daily. Any Foot concerns (callous, ulcer, wound, thickened nails, toenail fungus, skin fungus, hammer toe): none Last dilated eye exam: due  Current treatments: taking med daily. Medication compliance: excellent  Current diet: in general, a "healthy" diet   Current exercise: [facility] Known diabetic complications: none  The following portions of the patient's history were reviewed and updated as appropriate: allergies, current medications, past medical history, past social history and problem list.  ROS as in subjective above.     Objective:    Physical Exam Alert and in no distress. No sinus tenderness. Nares patent. Tympanic membranes and canals are normal. Pharyngeal area is normal. Neck is supple without adenopathy or thyromegaly. Cardiac exam shows a regular sinus rhythm without murmurs or gallops. Lungs are clear to auscultation. Skin warm and dry, no pallor. Extremities without edema, normal pulses. CN II-IX intact, PERRLA, EOMs intact.    Blood pressure (!) 160/90, pulse 75, resp. rate 16, weight 226 lb 9.6 oz (102.8 kg).  Lab Review Diabetic Labs Latest Ref Rng &  Units 09/21/2016 05/09/2016 04/18/2016 04/17/2016  HbA1c - 5.9% - 9.8(H) -  Microalbumin Not estab mg/dL - 0.7 - -  Micro/Creat Ratio <30 mcg/mg creat - 10 - -  Chol 125 - 200 mg/dL - 69(G97(L) - -  HDL >=29>=40 mg/dL - 52(W39(L) - -  Calc LDL <413<130 mg/dL - 50 - -  Triglycerides <150 mg/dL - 41 - -  Creatinine 2.440.70 - 1.33 mg/dL - 0.100.89 - 2.720.97   BP/Weight 09/21/2016 05/09/2016 04/25/2016 04/18/2016 04/18/2016  Systolic BP 160 132 140 120 155  Diastolic BP 90 82 90 66 108  Wt.  (Lbs) 226.6 222.2 224 222.8 -  BMI 31.43 30.82 31.24 31.07 -   No flowsheet data found.  Seferino  reports that he has never smoked. He has never used smokeless tobacco. He reports that he does not drink alcohol or use drugs.     Assessment & Plan:    Controlled type 2 diabetes mellitus without complication, without long-term current use of insulin (HCC) - Plan: HgB A1c, POCT glucose (manual entry), CBC with Differential/Platelet, Comprehensive metabolic panel, TSH  Elevated BP without diagnosis of hypertension  Stress  Paresthesia - Plan: CBC with Differential/Platelet, Comprehensive metabolic panel, TSH, Vitamin B12, RPR, HIV antibody  1. Rx changes: Metformin 500 mg once daily. A1c improved from 9.8% to 5.9%.  2. Education: Reviewed 'ABCs' of diabetes management (respective goals in parentheses):  A1C (<7), blood pressure (<130/80), and cholesterol (LDL <100). 3. Compliance at present is estimated to be excellent with lifestyle and medication but poor in regards to follow up visits.  Efforts to improve compliance (if necessary) will be directed at continue with healthy diet but avoid skipping meals, continue with exercise, check blood sugars twice daily and keep record. bring in readings . 4. Follow up: 2 weeks for BP and blood sugars. He is dealing with a lot of stress with work and home life and did not want to overwhelm him today but he will need to start taking a daily ace inhibitor for kidney preservation and a low dose statin for cardiovascular health due to diabetes diagnosis.  5. He is feeling back to baseline and his exam is unremarkable. Reassured him that this speaks to being nothing serious but will have him call or return if he noticing symptoms returning.  6. Will check labs for burning sensation in finger tips and feet. This has been ongoing without worsening over the past several years. Discussed possibility of starting medication but he declines.  7. Offered counseling and  medication for anxiety but he declines.

## 2016-09-22 LAB — HIV ANTIBODY (ROUTINE TESTING W REFLEX): HIV: NONREACTIVE

## 2016-09-22 LAB — RPR

## 2016-10-08 ENCOUNTER — Telehealth: Payer: Self-pay | Admitting: Family Medicine

## 2016-10-08 NOTE — Telephone Encounter (Signed)
Left message with pt that we need to schedule him a diabetes check per vickie

## 2016-12-19 ENCOUNTER — Telehealth: Payer: Self-pay | Admitting: Internal Medicine

## 2016-12-19 ENCOUNTER — Ambulatory Visit: Payer: BLUE CROSS/BLUE SHIELD | Admitting: Family Medicine

## 2016-12-19 NOTE — Telephone Encounter (Signed)

## 2016-12-19 NOTE — Telephone Encounter (Signed)
A. No follow up. He scheduled this appt or an acute complaint.

## 2016-12-21 ENCOUNTER — Encounter: Payer: Self-pay | Admitting: Family Medicine

## 2016-12-26 NOTE — Telephone Encounter (Signed)
Letter sent.

## 2017-04-10 ENCOUNTER — Encounter: Payer: Self-pay | Admitting: Family Medicine

## 2017-04-10 NOTE — Progress Notes (Deleted)
  Subjective:    Patient ID: Pedro Moyer, male    DOB: 11/14/1960, 56 y.o.   MRN: [id number]  Pedro Moyer is a 56 y.o. male who presents for follow-up of Type 2 diabetes mellitus.    BP elevated at last visit. No hx of HTN per record.  Statin? Eye exam? microalb and foot exam  CPAP? Anxiety  In September 2017 he had a cardiac workup in the ED with negative CTA, CXR, ECG, CBC, BNP, and troponin.  His blood sugar in the ED was in the 400s and his hemoglobin A1c was 9.8%.  He was started on Metformin at that time.  His A1c was last checked in March 2018 and had improved to 5.9%.   Patient {is/are not:32546} checking home blood sugars.   Home blood sugar records: {dm home sugars:14018} How often is blood sugars being checked: *** Current symptoms include: {dm sx:14075}. Patient denies {dm sx:19199}.  Patient {is/are not:32546} checking their feet daily. Any Foot concerns (callous, ulcer, wound, thickened nails, toenail fungus, skin fungus, hammer toe): *** Last dilated eye exam: ***  Current treatments: {dm interventions:14074}. Medication compliance: {good/fair/poor:33178}  Current diet: {diet habits:16563} Current exercise: {exercise types:16438} Known diabetic complications: {diabetes complications:1215}  The following portions of the patient's history were reviewed and updated as appropriate: allergies, current medications, past medical history, past social history and problem list.  ROS as in subjective above.     Objective:    Physical Exam Alert and in no distress otherwise not examined.  There were no vitals taken for this visit.  Lab Review Diabetic Labs Latest Ref Rng & Units 09/21/2016 05/09/2016 04/18/2016 04/17/2016  HbA1c - 5.9% - 9.8(H) -  Microalbumin Not estab mg/dL - 0.7 - -  Micro/Creat Ratio <30 mcg/mg creat - 10 - -  Chol 125 - 200 mg/dL - 40(J) - -  HDL >=81 mg/dL - 19(J) - -  Calc LDL <478 mg/dL - 50 - -  Triglycerides <150 mg/dL - 41 - -    Creatinine 0.70 - 1.33 mg/dL 2.95 6.21 - 3.08   BP/Weight 09/21/2016 05/09/2016 04/25/2016 04/18/2016 04/18/2016  Systolic BP 160 132 140 120 155  Diastolic BP 90 82 90 66 108  Wt. (Lbs) 226.6 222.2 224 222.8 -  BMI 31.43 30.82 31.24 31.07 -   No flowsheet data found.  Asmar  reports that he has never smoked. He has never used smokeless tobacco. He reports that he does not drink alcohol or use drugs.     Assessment & Plan:    Controlled type 2 diabetes mellitus without complication, without long-term current use of insulin (HCC)  Obstructive sleep apnea  1. Rx changes: {none:33079} 2. Education: Reviewed 'ABCs' of diabetes management (respective goals in parentheses):  A1C (<7), blood pressure (<130/80), and cholesterol (LDL <100). 3. Compliance at present is estimated to be {good/fair/poor:33178}. Efforts to improve compliance (if necessary) will be directed at {compliance:16716}. 4. Follow up: {NUMBERS; 0-10:33138} {time:11}

## 2017-04-11 ENCOUNTER — Ambulatory Visit: Payer: BLUE CROSS/BLUE SHIELD | Admitting: Family Medicine

## 2017-08-01 IMAGING — DX DG CHEST 2V
2 series · 2 of 2 positions shown · non-contrast
Comparison: 04/17/2016

CLINICAL DATA: Shortness of breath

EXAM:
CHEST  2 VIEW

[w chest pa]
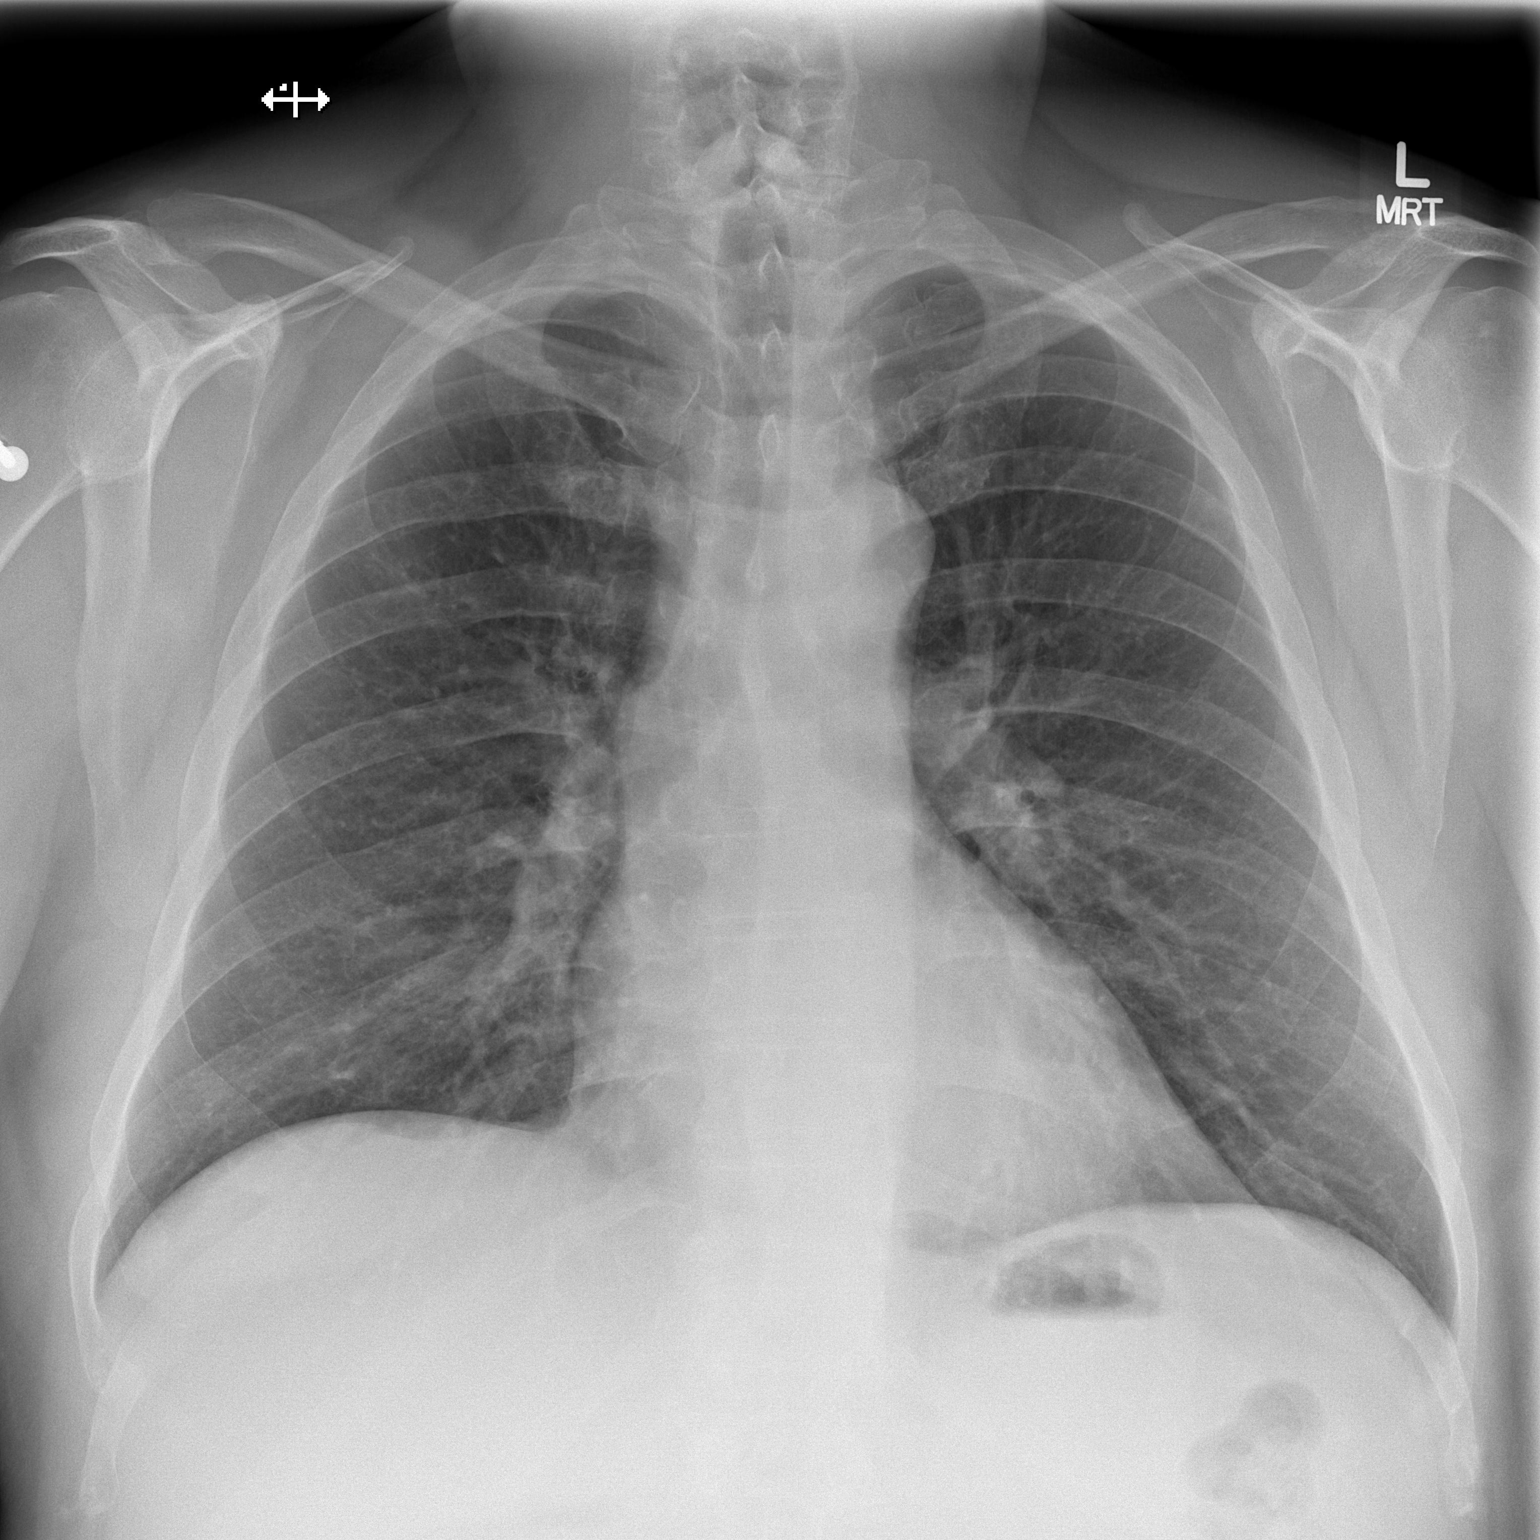

[w chest lat]
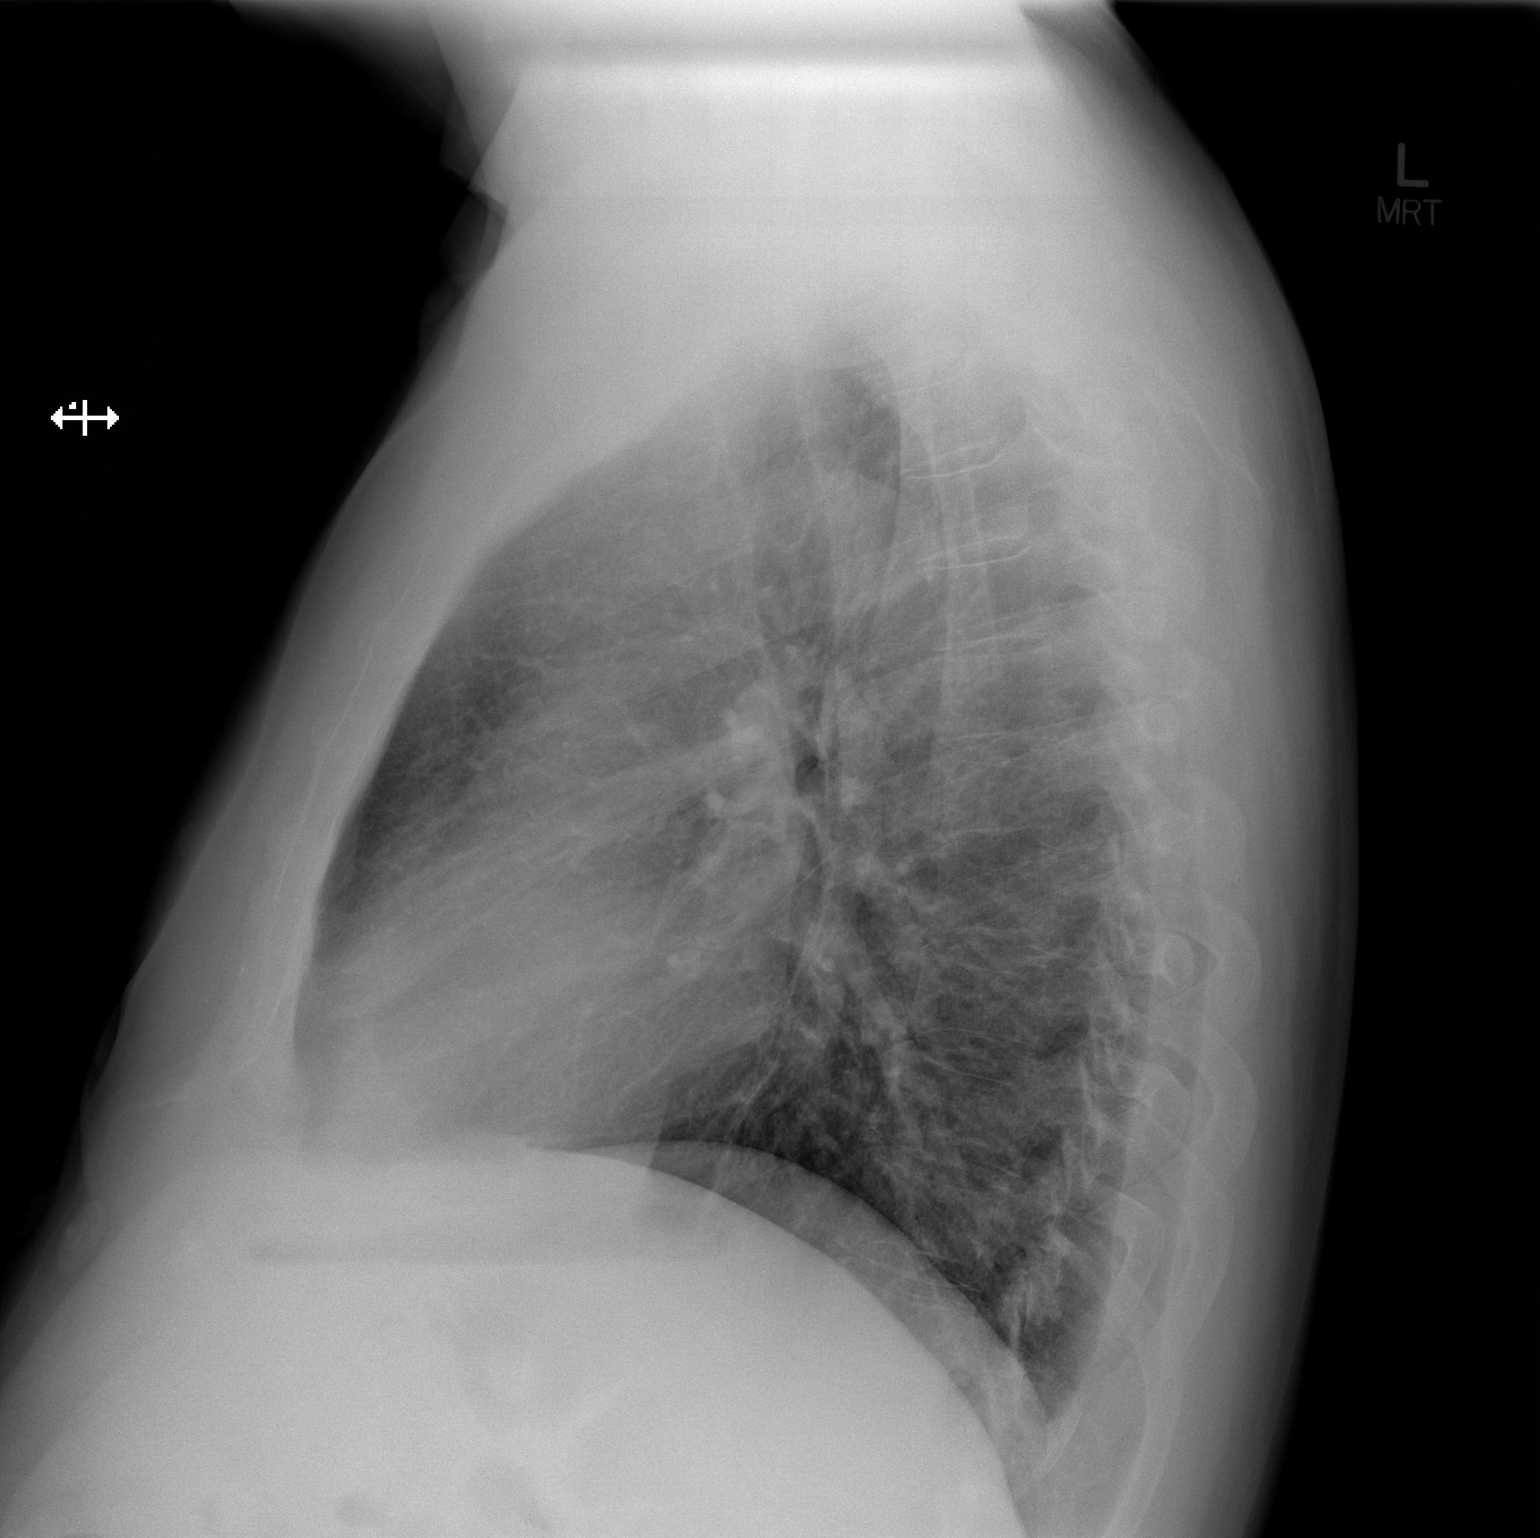

[2 of 2 positions shown; findings below may reference images not displayed]

FINDINGS: The heart size and mediastinal contours are within normal limits.
Both lungs are clear. The visualized skeletal structures are
unremarkable.
IMPRESSION: No active cardiopulmonary disease.

## 2017-08-29 ENCOUNTER — Ambulatory Visit: Payer: Self-pay | Admitting: Emergency Medicine

## 2017-08-29 DIAGNOSIS — Z23 Encounter for immunization: Secondary | ICD-10-CM

## 2017-08-29 NOTE — Progress Notes (Signed)
Pt presents here today for visit to receive Influenza vaccine. Allergies reviewed, vaccine given, vaccine information statement provided, tolerated well.   

## 2017-10-08 ENCOUNTER — Telehealth: Payer: Self-pay | Admitting: Family Medicine

## 2017-10-08 NOTE — Telephone Encounter (Signed)
Have him call the dentist who is covering for his regular dentist (their should be someone on call for his dentist). This is really a dentist issue and not a primary care issue.

## 2017-10-08 NOTE — Telephone Encounter (Signed)
Pt was notified that he would need to call his dentist as we can not handle that issues if pain was not tolerable with tynelol or ibuprofen then he needs to go to urgent care

## 2017-10-08 NOTE — Telephone Encounter (Signed)
Pt called and left message with answering service so returned pt's call and he states that he has a toothache and tooth infection. His dentist is out of the office all week and can not see him. [facility] [place] [facility] [address] or tell him what to do for now?

## 2018-07-04 ENCOUNTER — Ambulatory Visit: Payer: Self-pay | Admitting: Nurse Practitioner

## 2018-07-04 DIAGNOSIS — Z23 Encounter for immunization: Secondary | ICD-10-CM

## 2018-07-04 NOTE — Progress Notes (Signed)
Pt presents here today for visit to receive flu vaccine. Allergies reviewed, vaccine given, vaccine information statement provided, tolerated well.   

## 2018-07-30 ENCOUNTER — Telehealth: Payer: Self-pay | Admitting: Family Medicine

## 2018-07-30 NOTE — Telephone Encounter (Signed)
Dismissal letter in guarantor snapshot  °

## 2018-08-07 ENCOUNTER — Encounter: Payer: Self-pay | Admitting: Family Medicine

## 2018-08-07 ENCOUNTER — Ambulatory Visit (INDEPENDENT_AMBULATORY_CARE_PROVIDER_SITE_OTHER): Payer: BLUE CROSS/BLUE SHIELD | Admitting: Family Medicine

## 2018-08-07 VITALS — BP 130/90 | HR 77 | Temp 98.9°F | Resp 16 | Wt 223.0 lb

## 2018-08-07 DIAGNOSIS — R6889 Other general symptoms and signs: Secondary | ICD-10-CM

## 2018-08-07 DIAGNOSIS — J029 Acute pharyngitis, unspecified: Secondary | ICD-10-CM

## 2018-08-07 DIAGNOSIS — J101 Influenza due to other identified influenza virus with other respiratory manifestations: Secondary | ICD-10-CM

## 2018-08-07 LAB — POC INFLUENZA A&B (BINAX/QUICKVUE)
Influenza A, POC: NEGATIVE
Influenza B, POC: POSITIVE — AB

## 2018-08-07 LAB — POCT RAPID STREP A (OFFICE): Rapid Strep A Screen: NEGATIVE

## 2018-08-07 MED ORDER — OSELTAMIVIR PHOSPHATE 75 MG PO CAPS: 75.0000 mg | ORAL_CAPSULE | Freq: Two times a day (BID) | ORAL | 0 refills | Status: DC

## 2018-08-07 MED FILL — OSELTAMIVIR PHOS 75 MG CAP: 75 | 5 days supply | Qty: 10 | Fill #0

## 2018-08-07 NOTE — Progress Notes (Signed)
Subjective:  Pedro Moyer is a 58 y.o. male who presents for a 2-day history of chills, body aches, sore throat, congestion and cough. States his son tested positive for flu 4 days ago. Denies fever, headache, ear pain, dizziness, chest pain, shortness of breath, abdominal pain, nausea, vomiting, diarrhea.  Treatment to date: DayQuil and NyQuil.    No other aggravating or relieving factors.  No other c/o.  ROS as in subjective.   Objective: Vitals:   08/07/18 0925  BP: 130/90  Pulse: 77  Resp: 16  Temp: 98.9 F (37.2 C)  SpO2: 99%    General appearance: Alert, WD/WN, no distress, mildly ill appearing                             Skin: warm, no rash                           Head: no sinus tenderness                            Eyes: conjunctiva normal, corneas clear, PERRLA                            Ears: pearly TMs, external ear canals normal                          Nose: septum midline, turbinates swollen, with erythema and clear discharge             Mouth/throat: MMM, tongue normal, mild pharyngeal erythema, no edema or exudate                           Neck: supple, no adenopathy, no thyromegaly, nontender                          Heart: RRR, normal S1, S2, no murmurs                         Lungs: CTA bilaterally, no wheezes, rales, or rhonchi      Assessment: Influenza B - Plan: POC Influenza A&B(BINAX/QUICKVUE), oseltamivir (TAMIFLU) 75 MG capsule  Flu-like symptoms - Plan: POC Influenza A&B(BINAX/QUICKVUE)  Acute pharyngitis, unspecified etiology - Plan: POCT rapid strep A    Plan: Rapid strep negative Influenza B positive Discussed diagnosis and treatment of influenza.  He declines Tamiflu. advised good supportive care.  Suggested symptomatic OTC remedies.  Mucinex DM for cough and congestion.  Nasal saline spray for congestion.  Tylenol or Ibuprofen OTC for fever and malaise.  Call/return if symptoms are worsening or if he improves significantly and becomes  ill again.   He called back to the office and states he changed his mind and requests Tamiflu. Work not was given for today and tomorrow.

## 2018-08-07 NOTE — Patient Instructions (Signed)
You are positive for influenza B. You can take Tylenol or ibuprofen for fever, chills, body aches, and sore throat. Stay well-hydrated Mucinex DM for cough and congestion.  You can also take DayQuil or NyQuil instead if you would like.  If your symptoms are worsening over the next few days such as high fever, productive cough then let me know.   Influenza, Adult Influenza, more commonly known as "the flu," is a viral infection that mainly affects the respiratory tract. The respiratory tract includes organs that help you breathe, such as the lungs, nose, and throat. The flu causes many symptoms similar to the common cold along with high fever and body aches. The flu spreads easily from person to person (is contagious). Getting a flu shot (influenza vaccination) every year is the best way to prevent the flu. What are the causes? This condition is caused by the influenza virus. You can get the virus by:  Breathing in droplets that are in the air from an infected person's cough or sneeze.  Touching something that has been exposed to the virus (has been contaminated) and then touching your mouth, nose, or eyes. What increases the risk? The following factors may make you more likely to get the flu:  Not washing or sanitizing your hands often.  Having close contact with many people during cold and flu season.  Touching your mouth, eyes, or nose without first washing or sanitizing your hands.  Not getting a yearly (annual) flu shot. You may have a higher risk for the flu, including serious problems such as a lung infection (pneumonia), if you:  Are older than 65.  Are pregnant.  Have a weakened disease-fighting system (immune system). You may have a weakened immune system if you: ? Have HIV or AIDS. ? Are undergoing chemotherapy. ? Are taking medicines that reduce (suppress) the activity of your immune system.  Have a long-term (chronic) illness, such as heart disease, kidney disease,  diabetes, or lung disease.  Have a liver disorder.  Are severely overweight (morbidly obese).  Have anemia. This is a condition that affects your red blood cells.  Have asthma. What are the signs or symptoms? Symptoms of this condition usually begin suddenly and last 4-14 days. They may include:  Fever and chills.  Headaches, body aches, or muscle aches.  Sore throat.  Cough.  Runny or stuffy (congested) nose.  Chest discomfort.  Poor appetite.  Weakness or fatigue.  Dizziness.  Nausea or vomiting. How is this diagnosed? This condition may be diagnosed based on:  Your symptoms and medical history.  A physical exam.  Swabbing your nose or throat and testing the fluid for the influenza virus. How is this treated? If the flu is diagnosed early, you can be treated with medicine that can help reduce how severe the illness is and how long it lasts (antiviral medicine). This may be given by mouth (orally) or through an IV. Taking care of yourself at home can help relieve symptoms. Your health care provider may recommend:  Taking over-the-counter medicines.  Drinking plenty of fluids. In many cases, the flu goes away on its own. If you have severe symptoms or complications, you may be treated in a hospital. Follow these instructions at home: Activity  Rest as needed and get plenty of sleep.  Stay home from work or school as told by your health care provider. Unless you are visiting your health care provider, avoid leaving home until your fever has been gone for 24 hours  without taking medicine. Eating and drinking  Take an oral rehydration solution (ORS). This is a drink that is sold at pharmacies and retail stores.  Drink enough fluid to keep your urine pale yellow.  Drink clear fluids in small amounts as you are able. Clear fluids include water, ice chips, diluted fruit juice, and low-calorie sports drinks.  Eat bland, easy-to-digest foods in small amounts as  you are able. These foods include bananas, applesauce, rice, lean meats, toast, and crackers.  Avoid drinking fluids that contain a lot of sugar or caffeine, such as energy drinks, regular sports drinks, and soda.  Avoid alcohol.  Avoid spicy or fatty foods. General instructions      Take over-the-counter and prescription medicines only as told by your health care provider.  Use a cool mist humidifier to add humidity to the air in your home. This can make it easier to breathe.  Cover your mouth and nose when you cough or sneeze.  Wash your hands with soap and water often, especially after you cough or sneeze. If soap and water are not available, use alcohol-based hand sanitizer.  Keep all follow-up visits as told by your health care provider. This is important. How is this prevented?   Get an annual flu shot. You may get the flu shot in late summer, fall, or winter. Ask your health care provider when you should get your flu shot.  Avoid contact with people who are sick during cold and flu season. This is generally fall and winter. Contact a health care provider if:  You develop new symptoms.  You have: ? Chest pain. ? Diarrhea. ? A fever.  Your cough gets worse.  You produce more mucus.  You feel nauseous or you vomit. Get help right away if:  You develop shortness of breath or difficulty breathing.  Your skin or nails turn a bluish color.  You have severe pain or stiffness in your neck.  You develop a sudden headache or sudden pain in your face or ear.  You cannot eat or drink without vomiting. Summary  Influenza, more commonly known as "the flu," is a viral infection that primarily affects your respiratory tract.  Symptoms of the flu usually begin suddenly and last 4-14 days.  Getting an annual flu shot is the best way to prevent getting the flu.  Stay home from work or school as told by your health care provider. Unless you are visiting your health care  provider, avoid leaving home until your fever has been gone for 24 hours without taking medicine.  Keep all follow-up visits as told by your health care provider. This is important. This information is not intended to replace advice given to you by your health care provider. Make sure you discuss any questions you have with your health care provider. Document Released: 07/06/2000 Document Revised: 12/25/2017 Document Reviewed: 12/25/2017 Elsevier Interactive Patient Education  2019 [employer].

## 2022-12-23 ENCOUNTER — Encounter (HOSPITAL_COMMUNITY): Payer: Self-pay

## 2022-12-23 ENCOUNTER — Ambulatory Visit (HOSPITAL_COMMUNITY)
Admission: EM | Admit: 2022-12-23 | Discharge: 2022-12-23 | Disposition: A | Discharge status: Home / Self Care | Payer: BLUE CROSS/BLUE SHIELD | Attending: Family Medicine | Admitting: Family Medicine

## 2022-12-23 ENCOUNTER — Emergency Department (HOSPITAL_COMMUNITY): Payer: BLUE CROSS/BLUE SHIELD

## 2022-12-23 ENCOUNTER — Emergency Department (HOSPITAL_COMMUNITY)
Admission: EM | Admit: 2022-12-23 | Discharge: 2022-12-23 | Disposition: A | Discharge status: Home / Self Care | Payer: BLUE CROSS/BLUE SHIELD | Source: Home / Self Care | Attending: Emergency Medicine | Admitting: Emergency Medicine

## 2022-12-23 ENCOUNTER — Other Ambulatory Visit: Payer: Self-pay

## 2022-12-23 DIAGNOSIS — Z23 Encounter for immunization: Secondary | ICD-10-CM | POA: Insufficient documentation

## 2022-12-23 DIAGNOSIS — I10 Essential (primary) hypertension: Secondary | ICD-10-CM | POA: Insufficient documentation

## 2022-12-23 DIAGNOSIS — E11628 Type 2 diabetes mellitus with other skin complications: Secondary | ICD-10-CM | POA: Diagnosis not present

## 2022-12-23 DIAGNOSIS — L089 Local infection of the skin and subcutaneous tissue, unspecified: Secondary | ICD-10-CM

## 2022-12-23 DIAGNOSIS — A419 Sepsis, unspecified organism: Secondary | ICD-10-CM | POA: Diagnosis not present

## 2022-12-23 DIAGNOSIS — L03115 Cellulitis of right lower limb: Secondary | ICD-10-CM | POA: Insufficient documentation

## 2022-12-23 DIAGNOSIS — E1165 Type 2 diabetes mellitus with hyperglycemia: Secondary | ICD-10-CM | POA: Insufficient documentation

## 2022-12-23 DIAGNOSIS — L03031 Cellulitis of right toe: Secondary | ICD-10-CM

## 2022-12-23 DIAGNOSIS — R030 Elevated blood-pressure reading, without diagnosis of hypertension: Secondary | ICD-10-CM

## 2022-12-23 DIAGNOSIS — R739 Hyperglycemia, unspecified: Secondary | ICD-10-CM

## 2022-12-23 LAB — LACTIC ACID, PLASMA
Lactic Acid, Venous: 1.2 mmol/L (ref 0.5–1.9)
Lactic Acid, Venous: 0.9 mmol/L (ref 0.5–1.9)

## 2022-12-23 LAB — URINALYSIS, ROUTINE W REFLEX MICROSCOPIC
Bilirubin Urine: NEGATIVE
Glucose, UA: >=500 mg/dL — AB
Hgb urine dipstick: NEGATIVE
Ketones, ur: NEGATIVE mg/dL
Leukocytes,Ua: NEGATIVE
Nitrite: NEGATIVE
Protein, ur: 100 mg/dL — AB
Specific Gravity, Urine: 1.031 — ABNORMAL HIGH (ref 1.005–1.030)
pH: 6 (ref 5.0–8.0)

## 2022-12-23 LAB — CBC WITH DIFFERENTIAL/PLATELET
Abs Immature Granulocytes: 0.05 10*3/uL (ref 0.00–0.07)
Basophils Absolute: 0 10*3/uL (ref 0.0–0.1)
Basophils Relative: 0 %
Eosinophils Absolute: 0.1 10*3/uL (ref 0.0–0.5)
Eosinophils Relative: 1 %
HCT: 46 % (ref 39.0–52.0)
Hemoglobin: 15.9 g/dL (ref 13.0–17.0)
Immature Granulocytes: 0 %
Lymphocytes Relative: 15 %
Lymphs Abs: 1.9 10*3/uL (ref 0.7–4.0)
MCH: 28.9 pg (ref 26.0–34.0)
MCHC: 34.6 g/dL (ref 30.0–36.0)
MCV: 83.6 fL (ref 80.0–100.0)
Monocytes Absolute: 1 10*3/uL (ref 0.1–1.0)
Monocytes Relative: 8 %
Neutro Abs: 9.2 10*3/uL — ABNORMAL HIGH (ref 1.7–7.7)
Neutrophils Relative %: 76 %
Platelets: 214 10*3/uL (ref 150–400)
RBC: 5.5 MIL/uL (ref 4.22–5.81)
RDW: 13.6 % (ref 11.5–15.5)
WBC: 12.3 10*3/uL — ABNORMAL HIGH (ref 4.0–10.5)
nRBC: 0 % (ref 0.0–0.2)

## 2022-12-23 LAB — COMPREHENSIVE METABOLIC PANEL
ALT: 29 U/L (ref 0–44)
AST: 20 U/L (ref 15–41)
Albumin: 4.1 g/dL (ref 3.5–5.0)
Alkaline Phosphatase: 79 U/L (ref 38–126)
Anion gap: 8 (ref 5–15)
BUN: 15 mg/dL (ref 8–23)
CO2: 25 mmol/L (ref 22–32)
Calcium: 9 mg/dL (ref 8.9–10.3)
Chloride: 101 mmol/L (ref 98–111)
Creatinine, Ser: 1.06 mg/dL (ref 0.61–1.24)
GFR, Estimated: >60 mL/min (ref >60)
Glucose, Bld: 391 mg/dL — ABNORMAL HIGH (ref 70–99)
Potassium: 3.5 mmol/L (ref 3.5–5.1)
Sodium: 134 mmol/L — ABNORMAL LOW (ref 135–145)
Total Bilirubin: 2.9 mg/dL — ABNORMAL HIGH (ref 0.3–1.2)
Total Protein: 7.9 g/dL (ref 6.5–8.1)

## 2022-12-23 LAB — CBG MONITORING, ED: Glucose-Capillary: 441 mg/dL — ABNORMAL HIGH (ref 70–99)

## 2022-12-23 LAB — BLOOD GAS, VENOUS
Acid-Base Excess: 1.4 mmol/L (ref 0.0–2.0)
Bicarbonate: 27.2 mmol/L (ref 20.0–28.0)
O2 Saturation: 59.8 %
Patient temperature: 37
pCO2, Ven: 46 mmHg (ref 44–60)
pH, Ven: 7.38 (ref 7.25–7.43)
pO2, Ven: 31 mmHg — CL (ref 32–45)

## 2022-12-23 LAB — POCT FASTING CBG KUC MANUAL ENTRY: POCT Glucose (KUC): 375 mg/dL — AB (ref 70–99)

## 2022-12-23 LAB — BETA-HYDROXYBUTYRIC ACID: Beta-Hydroxybutyric Acid: 0.08 mmol/L (ref 0.05–0.27)

## 2022-12-23 MED ORDER — TETANUS-DIPHTH-ACELL PERTUSSIS 5-2.5-18.5 LF-MCG/0.5 IM SUSY
0.5000 mL | PREFILLED_SYRINGE | Freq: Once | INTRAMUSCULAR | Status: AC
  Administered 2022-12-23: 0.5 mL via INTRAMUSCULAR
  Filled 2022-12-23: qty 0.5

## 2022-12-23 MED ORDER — HYDROCHLOROTHIAZIDE 12.5 MG PO TABS
12.5000 mg | ORAL_TABLET | Freq: Once | ORAL | Status: AC
  Administered 2022-12-23: 12.5 mg via ORAL
  Filled 2022-12-23: qty 1

## 2022-12-23 MED ORDER — CEPHALEXIN 500 MG PO CAPS
500.0000 mg | ORAL_CAPSULE | Freq: Once | ORAL | Status: AC
  Administered 2022-12-23: 500 mg via ORAL
  Filled 2022-12-23: qty 1

## 2022-12-23 MED ORDER — CEPHALEXIN 500 MG PO CAPS: 500.0000 mg | ORAL_CAPSULE | Freq: Four times a day (QID) | ORAL | 0 refills | Status: DC

## 2022-12-23 MED ORDER — HYDROCHLOROTHIAZIDE 25 MG PO TABS: 25.0000 mg | ORAL_TABLET | Freq: Every day | ORAL | 0 refills | Status: AC

## 2022-12-23 NOTE — Discharge Instructions (Signed)
Please proceed to the emergency room for further evaluation and care  Your sugar was 375

## 2022-12-23 NOTE — ED Triage Notes (Signed)
Pt got metal stuck in right foot, noticed it yesterday and pulled it out. Unsure how long it was there. Multiple wound marks in right foot with various stages of healing. Pt is diabetic and has high blood pressure, but is not on medications to control BP. BP is high on arrival. Pt denies headache, dizziness or other sx.

## 2022-12-23 NOTE — ED Provider Notes (Signed)
[place] EMERGENCY DEPARTMENT AT [facility] Provider Note   CSN: [id number] Arrival date & time: 12/23/22  1210     History  Chief Complaint  Patient presents with   Foot Pain   Hypertension    Pedro Moyer is a 62 y.o. male.  The history is provided by the patient and medical records. No language interpreter was used.  Foot Pain This is a new problem. The current episode started more than 2 days ago. The problem occurs constantly. The problem has been gradually worsening. Pertinent negatives include no chest pain, no abdominal pain, no headaches and no shortness of breath. Nothing aggravates the symptoms. Nothing relieves the symptoms. He has tried nothing for the symptoms. The treatment provided no relief.  Hypertension Pertinent negatives include no chest pain, no abdominal pain, no headaches and no shortness of breath.       Home Medications Prior to Admission medications   Not on File      Allergies    Codeine    Review of Systems   Review of Systems  Constitutional:  Negative for chills, fatigue and fever.  HENT:  Negative for congestion.   Respiratory:  Negative for cough, chest tightness, shortness of breath and wheezing.   Cardiovascular:  Negative for chest pain and palpitations.  Gastrointestinal:  Negative for abdominal pain, constipation, diarrhea, nausea and vomiting.  Genitourinary:  Negative for dysuria and flank pain.  Musculoskeletal:  Negative for back pain, neck pain and neck stiffness.  Skin:  Positive for color change, rash and wound.  Neurological:  Negative for weakness, light-headedness, numbness and headaches.  Psychiatric/Behavioral:  Negative for agitation and confusion.   All other systems reviewed and are negative.   Physical Exam Updated Vital Signs BP (!) 205/101   Pulse 85   Temp 97.9 F (36.6 C) (Oral)   Resp 18   Ht 5\' 11"  (1.803 m)   Wt 101.2 kg   SpO2 99%   BMI 31.12 kg/m  Physical Exam Vitals and  nursing note reviewed.  Constitutional:      General: He is not in acute distress.    Appearance: He is well-developed. He is not ill-appearing, toxic-appearing or diaphoretic.  HENT:     Head: Normocephalic and atraumatic.     Nose: Nose normal.     Mouth/Throat:     Mouth: Mucous membranes are moist.  Eyes:     Conjunctiva/sclera: Conjunctivae normal.  Cardiovascular:     Rate and Rhythm: Normal rate and regular rhythm.     Heart sounds: No murmur heard. Pulmonary:     Effort: Pulmonary effort is normal. No respiratory distress.     Breath sounds: Normal breath sounds. No wheezing, rhonchi or rales.  Chest:     Chest wall: No tenderness.  Abdominal:     Palpations: Abdomen is soft.     Tenderness: There is no abdominal tenderness. There is no guarding or rebound.  Musculoskeletal:        General: Swelling and tenderness present.     Cervical back: Neck supple.  Skin:    General: Skin is warm and dry.     Capillary Refill: Capillary refill takes less than 2 seconds.     Findings: Erythema present.  Neurological:     General: No focal deficit present.     Mental Status: He is alert.     Sensory: No sensory deficit.     Motor: No weakness.  Psychiatric:  Mood and Affect: Mood normal.          ED Results / Procedures / Treatments   Labs (all labs ordered are listed, but only abnormal results are displayed) Labs Reviewed  CBC WITH DIFFERENTIAL/PLATELET - Abnormal; Notable for the following components:      Result Value   WBC 12.3 (*)    Neutro Abs 9.2 (*)    All other components within normal limits  COMPREHENSIVE METABOLIC PANEL - Abnormal; Notable for the following components:   Sodium 134 (*)    Glucose, Bld 391 (*)    Total Bilirubin 2.9 (*)    All other components within normal limits  BLOOD GAS, VENOUS - Abnormal; Notable for the following components:   pO2, Ven 31 (*)    All other components within normal limits  URINALYSIS, ROUTINE W REFLEX  MICROSCOPIC - Abnormal; Notable for the following components:   Specific Gravity, Urine 1.031 (*)    Glucose, UA >=500 (*)    Protein, ur 100 (*)    Bacteria, UA RARE (*)    All other components within normal limits  CBG MONITORING, ED - Abnormal; Notable for the following components:   Glucose-Capillary 441 (*)    All other components within normal limits  CULTURE, BLOOD (ROUTINE X 2)  CULTURE, BLOOD (ROUTINE X 2)  LACTIC ACID, PLASMA  LACTIC ACID, PLASMA  BETA-HYDROXYBUTYRIC ACID    EKG None  Radiology DG Foot Complete Right  Result Date: 12/23/2022 CLINICAL DATA:  Recent trauma, pain and swelling EXAM: RIGHT FOOT COMPLETE - 3+ VIEW COMPARISON:  None Available. FINDINGS: No recent fracture or dislocation is seen. There is mild hallux valgus deformity. Tiny bony spurs are seen in first metatarsophalangeal joint. There is soft tissue swelling in the big toe. No focal lytic lesions are seen. There are no radiopaque foreign bodies. IMPRESSION: No fracture or dislocation is seen. There are no opaque foreign bodies. There are no focal lytic lesions. Electronically Signed   By: Ernie Avena M.D.   On: 12/23/2022 15:16    Procedures Procedures    Medications Ordered in ED Medications  Tdap (BOOSTRIX) injection 0.5 mL (0.5 mLs Intramuscular Given 12/23/22 1517)  cephALEXin (KEFLEX) capsule 500 mg (500 mg Oral Given 12/23/22 1849)  hydrochlorothiazide (HYDRODIURIL) tablet 12.5 mg (12.5 mg Oral Given 12/23/22 1848)    ED Course/ Medical Decision Making/ A&P                             Medical Decision Making Amount and/or Complexity of Data Reviewed Labs: ordered. Radiology: ordered.  Risk Prescription drug management.    Pedro Moyer is a 62 y.o. male with a past medical history significant for previously treated diabetes and borderline hypertension who presents from urgent care with concern for foot infection.  According to patient, he was diagnosed with diabetes several  years ago and lost 80 pounds and had been off of medications without hyperglycemia for years.  He also said his blood pressure runs high but he has never formally been diagnosed with hypertension or started on antibiotics.  He reports that he was at [employer] 2 weeks ago and got back last Friday.  He said that he walked approximately 31 miles without any difficulty, shortness of breath, or chest pain during the trip but after arriving back noticed blisters on the right great toe.  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  He noticed that over the last few days he was developing some swelling and redness at the toe and then last night he pulled out a shard of metal out of the toe and this was a concern to him.  He reports he does work with [employer] and may have had a metal shaving fall into his boot but he is unsure.  He is unsure of his last tetanus shot.  He went to urgent care today and due to the toe redness ascending up towards the ankle, hyperglycemia without current diabetes treatment, and blood pressure over 200s without being on any blood pressure medicine, he was sent in for evaluation for further management.  Patient denies any history of significant foot infections and denies history of osteomyelitis.  He reports that he was treated for his neuropathies about 6 months ago and uses stimulators in his feet.  He otherwise reports that he difficulty with ambulation and is denying significant leg pain aside from the right foot and toe.  On exam, patient does have erythematous and swollen right great toe that is erythematous going towards his ankle.  It is slightly tender but no crepitance.  There is no fluctuance and patient does have a callus that is there normally that he does not think is infected.  He did not see any further metal fragments but agrees that appears infected.  He had clear breath sounds and chest and abdomen were nontender.   Good pulses in extremities.  Blood pressure was in the 190s on my evaluation but was over 200 on arrival.  Will get x-ray of the foot to rule out bony involvement or subcutaneous gas.  Will get screening labs.  Will get blood cultures.  We will monitor his blood pressure, unclear if patient truly has new hypertension diagnosis or if he is having reaction in the setting of what I suspect is a ascending foot infection.  Anticipate reassessment after workup to determine if patient we have for outpatient antibiotics or patient will need admission for further management.                6:38 PM Patient had workup that returned and showed no evidence of acute bony infection or fracture.  Doubt osteomyelitis at this time.  Labs show leukocytosis but otherwise were overall similar to prior.  Blood pressure has improved to the 180s.  He reports that he had not been on blood pressure medicine since his weight loss but he does agree with starting something again.  Will start HCTZ and we offered admission for his infection, elevated blood pressure, elevated glucoses but he would rather go home.  He reports his sugar was high likely because he just had had a full sugar ginger ale before his blood was drawn earlier.  Patient would rather talk to his doctor about glucose management.  We will give prescription for Keflex and give him a dose now, start him on HCTZ, and have him follow-up with his doctor tomorrow.  He understands extremely strict return precautions as we discussed this foot infection could worsen and lead to sepsis and worsened infection.  He knows to watch his sugars and to follow-up with his primary doctor for both blood pressure and glucose and infection management.  He had no questions or concerns and was discharged in what appeared to be stable condition.          Final Clinical Impression(s) / ED Diagnoses Final diagnoses:  Cellulitis  of toe of right foot  Elevated blood pressure  reading  Hyperglycemia    Rx / DC Orders ED Discharge Orders          Ordered    cephALEXin (KEFLEX) 500 MG capsule  4 times daily        12/23/22 1841    hydrochlorothiazide (HYDRODIURIL) 25 MG tablet  Daily        12/23/22 1841            Clinical Impression: 1. Cellulitis of toe of right foot   2. Elevated blood pressure reading   3. Hyperglycemia     Disposition: Discharge  Condition: Good  I have discussed the results, Dx and Tx plan with the pt(& family if present). He/she/they expressed understanding and agree(s) with the plan. Discharge instructions discussed at great length. Strict return precautions discussed and pt &/or family have verbalized understanding of the instructions. No further questions at time of discharge.    New Prescriptions   CEPHALEXIN (KEFLEX) 500 MG CAPSULE    Take 1 capsule (500 mg total) by mouth 4 (four) times daily for 7 days.   HYDROCHLOROTHIAZIDE (HYDRODIURIL) 25 MG TABLET    Take 1 tablet (25 mg total) by mouth daily.    Follow Up: your PCP     [facility] Emergency Department at [facility] [address] [id number] mc [place] [address] [phone]        Yatzary Merriweather, Canary Brim, MD 12/23/22 1859

## 2022-12-23 NOTE — Discharge Instructions (Signed)
Your history, exam, and workup today revealed infection to your toe that is likely contributing to your elevated blood pressure and elevated glucoses.  As you previously were on glucose medicine, please call your doctor to consider getting started back on them.  We will restart some blood pressure medicine for you but please monitor as well so it does not drop too low when your infection starts to improve.  Please rest and stay hydrated.  We had a shared decision-making conversation offering admission for the elevated glucose, elevated blood pressure, and foot infection however we will prefer to try treating at home with antibiotics initially.  If symptoms change or worsen rapidly, please return to the nearest emergency department for likely admission.  Please call your doctor tomorrow to get seen a soon as possible.

## 2022-12-23 NOTE — ED Triage Notes (Signed)
Pt stated he pull a nail out of his right great toe 1 week ago.  Pt reports pain and swelling. Pt will need a TDAP today.

## 2022-12-23 NOTE — ED Provider Notes (Addendum)
[facility]    CSN: [id number] Arrival date & time: 12/23/22  1022      History   Chief Complaint Chief Complaint  Patient presents with   Toe Injury    HPI Pedro Moyer is a 62 y.o. male.   HPI Here with swelling and tenderness and pain in his right great toe.  He did been hurting to walk on that toe for 2 or 3 days, possibly.  Yesterday he noted that there was some swelling and pulled a piece of metal out of his toe.  He does not know how long it has been there and there  He had walked a bunch at [employer] in the prior days.  He does have a history of diabetes, but it has been diet controlled since he lost 80 pounds few years ago.  He does not check his sugars a lot and has not checked it in the last 2 or 3 days.  Last tetanus shot was 2017.  No fever, but he has maybe had some chills in the last day or so   Past Medical History:  Diagnosis Date   Borderline hypertension    Diabetes (HCC)    diagnosed 2017   Diabetes mellitus type 2, controlled, without complications (HCC) 05/09/2016   Environmental allergies    Hypertension    Sleep apnea     Patient Active Problem List   Diagnosis Date Noted   Diabetes mellitus type 2, controlled, without complications (HCC) 05/09/2016   Dyspnea 04/18/2016   Obstructive sleep apnea 03/19/2014    Past Surgical History:  Procedure Laterality Date   SHOULDER SURGERY  1981   WISDOM TOOTH EXTRACTION         Home Medications    Prior to Admission medications   Not on File    Family History Family History  Problem Relation Age of Onset   Lung cancer Mother        died at 73   Diabetes Mother    Other Father        blood disease-ETOH abuse   Cirrhosis Father    Diabetes Brother    Diabetes Brother    Diabetes Brother     Social History Social History   Tobacco Use   Smoking status: Never   Smokeless tobacco: Never  Substance Use Topics   Alcohol use: No   Drug use: No     Allergies    Codeine   Review of Systems Review of Systems   Physical Exam Triage Vital Signs ED Triage Vitals [12/23/22 1130]  Enc Vitals Group     BP (!) 211/113     Pulse Rate (!) 101     Resp 18     Temp 98.1 F (36.7 C)     Temp Source Oral     SpO2 97 %     Weight      Height      Head Circumference      Peak Flow      Pain Score      Pain Loc      Pain Edu?      Excl. in GC?    No data found.  Updated Vital Signs BP (!) 218/101   Pulse (!) 101   Temp 98.1 F (36.7 C) (Oral)   Resp 18   SpO2 97%   Visual Acuity Right Eye Distance:   Left Eye Distance:   Bilateral Distance:    Right Eye Near:  Left Eye Near:    Bilateral Near:     Physical Exam Vitals reviewed.  Constitutional:      General: He is not in acute distress.    Appearance: He is not ill-appearing, toxic-appearing or diaphoretic.  HENT:     Mouth/Throat:     Mouth: Mucous membranes are moist.  Skin:    Coloration: Skin is not jaundiced or pale.     Comments: There is swelling of the right great toe.  And there is some mild erythema of the whole toe onto the first MTP joint.  There is a yellow callus that is about 2.5 cm in diameter I cannot tell that it is fluctuant.  He is more tender over on the toe pad.  Neurological:     General: No focal deficit present.     Mental Status: He is alert and oriented to person, place, and time.  Psychiatric:        Behavior: Behavior normal.      UC Treatments / Results  Labs (all labs ordered are listed, but only abnormal results are displayed) Labs Reviewed  POCT FASTING CBG [facility] MANUAL ENTRY - Abnormal; Notable for the following components:      Result Value   POCT Glucose ([facility]) 375 (*)    All other components within normal limits    EKG   Radiology No results found.  Procedures Procedures (including critical care time)  Medications Ordered in UC Medications - No data to display  Initial Impression / Assessment and Plan / UC Course  I  have reviewed the triage vital signs and the nursing notes.  Pertinent labs & imaging results that were available during my care of the patient were reviewed by me and considered in my medical decision making (see chart for details).        Fingerstick glucose is 375.  I am concerned that there may be abscess deep in the tissues of the great toe.  With his sugar being high I am concerned that he may need further evaluation such as advanced imaging and stat labs.  He is going to proceed to the emergency room by private car Final Clinical Impressions(s) / UC Diagnoses   Final diagnoses:  Diabetic foot infection [facility])     Discharge Instructions      Please proceed to the emergency room for further evaluation and care  Your sugar was 375     ED Prescriptions   None    PDMP not reviewed this encounter.   Zenia Resides, MD 12/23/22 1153    Zenia Resides, MD 12/23/22 [phone]

## 2022-12-24 LAB — CULTURE, BLOOD (ROUTINE X 2)
Culture: NO GROWTH
Special Requests: ADEQUATE

## 2022-12-25 ENCOUNTER — Inpatient Hospital Stay (HOSPITAL_COMMUNITY)
Admission: EM | Admit: 2022-12-25 | Discharge: 2022-12-30 | DRG: 854 | Disposition: A | Discharge status: Home / Self Care | Payer: BLUE CROSS/BLUE SHIELD | Attending: Family Medicine | Admitting: Family Medicine

## 2022-12-25 ENCOUNTER — Inpatient Hospital Stay (HOSPITAL_COMMUNITY): Payer: BLUE CROSS/BLUE SHIELD

## 2022-12-25 ENCOUNTER — Emergency Department (HOSPITAL_COMMUNITY): Payer: BLUE CROSS/BLUE SHIELD

## 2022-12-25 ENCOUNTER — Other Ambulatory Visit: Payer: Self-pay

## 2022-12-25 ENCOUNTER — Encounter (HOSPITAL_COMMUNITY): Payer: Self-pay

## 2022-12-25 DIAGNOSIS — E1169 Type 2 diabetes mellitus with other specified complication: Secondary | ICD-10-CM | POA: Diagnosis present

## 2022-12-25 DIAGNOSIS — Z833 Family history of diabetes mellitus: Secondary | ICD-10-CM

## 2022-12-25 DIAGNOSIS — Z23 Encounter for immunization: Secondary | ICD-10-CM | POA: Diagnosis not present

## 2022-12-25 DIAGNOSIS — E11621 Type 2 diabetes mellitus with foot ulcer: Secondary | ICD-10-CM | POA: Diagnosis present

## 2022-12-25 DIAGNOSIS — L089 Local infection of the skin and subcutaneous tissue, unspecified: Secondary | ICD-10-CM | POA: Diagnosis not present

## 2022-12-25 DIAGNOSIS — A419 Sepsis, unspecified organism: Secondary | ICD-10-CM | POA: Diagnosis present

## 2022-12-25 DIAGNOSIS — L02611 Cutaneous abscess of right foot: Secondary | ICD-10-CM | POA: Diagnosis present

## 2022-12-25 DIAGNOSIS — G4733 Obstructive sleep apnea (adult) (pediatric): Secondary | ICD-10-CM | POA: Diagnosis present

## 2022-12-25 DIAGNOSIS — Z885 Allergy status to narcotic agent status: Secondary | ICD-10-CM

## 2022-12-25 DIAGNOSIS — Z801 Family history of malignant neoplasm of trachea, bronchus and lung: Secondary | ICD-10-CM

## 2022-12-25 DIAGNOSIS — E1165 Type 2 diabetes mellitus with hyperglycemia: Secondary | ICD-10-CM | POA: Diagnosis present

## 2022-12-25 DIAGNOSIS — E1140 Type 2 diabetes mellitus with diabetic neuropathy, unspecified: Secondary | ICD-10-CM | POA: Diagnosis present

## 2022-12-25 DIAGNOSIS — M6701 Short Achilles tendon (acquired), right ankle: Secondary | ICD-10-CM

## 2022-12-25 DIAGNOSIS — E669 Obesity, unspecified: Secondary | ICD-10-CM | POA: Diagnosis present

## 2022-12-25 DIAGNOSIS — Z79899 Other long term (current) drug therapy: Secondary | ICD-10-CM

## 2022-12-25 DIAGNOSIS — E11628 Type 2 diabetes mellitus with other skin complications: Secondary | ICD-10-CM | POA: Diagnosis present

## 2022-12-25 DIAGNOSIS — M869 Osteomyelitis, unspecified: Secondary | ICD-10-CM

## 2022-12-25 DIAGNOSIS — L03031 Cellulitis of right toe: Secondary | ICD-10-CM | POA: Diagnosis present

## 2022-12-25 DIAGNOSIS — I10 Essential (primary) hypertension: Secondary | ICD-10-CM

## 2022-12-25 DIAGNOSIS — M60871 Other myositis, right ankle and foot: Secondary | ICD-10-CM | POA: Diagnosis present

## 2022-12-25 DIAGNOSIS — L97519 Non-pressure chronic ulcer of other part of right foot with unspecified severity: Secondary | ICD-10-CM | POA: Diagnosis present

## 2022-12-25 DIAGNOSIS — Z6831 Body mass index (BMI) 31.0-31.9, adult: Secondary | ICD-10-CM | POA: Diagnosis not present

## 2022-12-25 DIAGNOSIS — M868X7 Other osteomyelitis, ankle and foot: Secondary | ICD-10-CM | POA: Diagnosis present

## 2022-12-25 HISTORY — DX: Other specified postprocedural states: Z98.890

## 2022-12-25 LAB — CBC WITH DIFFERENTIAL/PLATELET
Abs Immature Granulocytes: 0.04 10*3/uL (ref 0.00–0.07)
Basophils Absolute: 0.1 10*3/uL (ref 0.0–0.1)
Basophils Relative: 0 %
Eosinophils Absolute: 0.1 10*3/uL (ref 0.0–0.5)
Eosinophils Relative: 1 %
HCT: 44.5 % (ref 39.0–52.0)
Hemoglobin: 15.5 g/dL (ref 13.0–17.0)
Immature Granulocytes: 0 %
Lymphocytes Relative: 10 %
Lymphs Abs: 1.2 10*3/uL (ref 0.7–4.0)
MCH: 28.6 pg (ref 26.0–34.0)
MCHC: 34.8 g/dL (ref 30.0–36.0)
MCV: 82.1 fL (ref 80.0–100.0)
Monocytes Absolute: 0.9 10*3/uL (ref 0.1–1.0)
Monocytes Relative: 7 %
Neutro Abs: 10.3 10*3/uL — ABNORMAL HIGH (ref 1.7–7.7)
Neutrophils Relative %: 82 %
Platelets: 254 10*3/uL (ref 150–400)
RBC: 5.42 MIL/uL (ref 4.22–5.81)
RDW: 13.2 % (ref 11.5–15.5)
WBC: 12.6 10*3/uL — ABNORMAL HIGH (ref 4.0–10.5)
nRBC: 0 % (ref 0.0–0.2)

## 2022-12-25 LAB — COMPREHENSIVE METABOLIC PANEL
ALT: 27 U/L (ref 0–44)
AST: 18 U/L (ref 15–41)
Albumin: 4 g/dL (ref 3.5–5.0)
Alkaline Phosphatase: 89 U/L (ref 38–126)
Anion gap: 10 (ref 5–15)
BUN: 27 mg/dL — ABNORMAL HIGH (ref 8–23)
CO2: 25 mmol/L (ref 22–32)
Calcium: 9.4 mg/dL (ref 8.9–10.3)
Chloride: 98 mmol/L (ref 98–111)
Creatinine, Ser: 1.14 mg/dL (ref 0.61–1.24)
GFR, Estimated: >60 mL/min (ref >60)
Glucose, Bld: 341 mg/dL — ABNORMAL HIGH (ref 70–99)
Potassium: 3.9 mmol/L (ref 3.5–5.1)
Sodium: 133 mmol/L — ABNORMAL LOW (ref 135–145)
Total Bilirubin: 1.4 mg/dL — ABNORMAL HIGH (ref 0.3–1.2)
Total Protein: 8.3 g/dL — ABNORMAL HIGH (ref 6.5–8.1)

## 2022-12-25 LAB — GLUCOSE, CAPILLARY: Glucose-Capillary: 325 mg/dL — ABNORMAL HIGH (ref 70–99)

## 2022-12-25 LAB — LACTIC ACID, PLASMA: Lactic Acid, Venous: 0.5 mmol/L (ref 0.5–1.9)

## 2022-12-25 LAB — CULTURE, BLOOD (ROUTINE X 2)

## 2022-12-25 LAB — SEDIMENTATION RATE: Sed Rate: 39 mm/hr — ABNORMAL HIGH (ref 0–16)

## 2022-12-25 MED ORDER — SODIUM CHLORIDE 0.9 % IV BOLUS
1000.0000 mL | Freq: Once | INTRAVENOUS | Status: AC
  Administered 2022-12-25: 1000 mL via INTRAVENOUS

## 2022-12-25 MED ORDER — ACETAMINOPHEN 325 MG PO TABS
650.0000 mg | ORAL_TABLET | Freq: Four times a day (QID) | ORAL | Status: DC | PRN
  Administered 2022-12-25 – 2022-12-29 (×4): 650 mg via ORAL
  Filled 2022-12-25 (×4): qty 2

## 2022-12-25 MED ORDER — METRONIDAZOLE 500 MG/100ML IV SOLN
500.0000 mg | Freq: Two times a day (BID) | INTRAVENOUS | Status: AC
  Administered 2022-12-25 – 2022-12-29 (×7): 500 mg via INTRAVENOUS
  Filled 2022-12-25 (×7): qty 100

## 2022-12-25 MED ORDER — GADOBUTROL 1 MMOL/ML IV SOLN
10.0000 mL | Freq: Once | INTRAVENOUS | Status: AC | PRN
  Administered 2022-12-25: 10 mL via INTRAVENOUS

## 2022-12-25 MED ORDER — VANCOMYCIN HCL 1750 MG/350ML IV SOLN
1750.0000 mg | Freq: Once | INTRAVENOUS | Status: AC
  Administered 2022-12-25: 1750 mg via INTRAVENOUS
  Filled 2022-12-25: qty 350

## 2022-12-25 MED ORDER — SODIUM CHLORIDE 0.9 % IV SOLN
2.0000 g | Freq: Once | INTRAVENOUS | Status: AC
  Administered 2022-12-25: 2 g via INTRAVENOUS
  Filled 2022-12-25: qty 20

## 2022-12-25 MED ORDER — INSULIN ASPART 100 UNIT/ML IJ SOLN
0.0000 [IU] | Freq: Three times a day (TID) | INTRAMUSCULAR | Status: DC
  Filled 2022-12-25: qty 0.15

## 2022-12-25 MED ORDER — HYDROCODONE-ACETAMINOPHEN 5-325 MG PO TABS
1.0000 | ORAL_TABLET | ORAL | Status: DC | PRN
  Administered 2022-12-27 (×2): 1 via ORAL
  Administered 2022-12-29 – 2022-12-30 (×5): 2 via ORAL
  Filled 2022-12-25 (×4): qty 2
  Filled 2022-12-25 (×2): qty 1
  Filled 2022-12-25: qty 2

## 2022-12-25 MED ORDER — LISINOPRIL 20 MG PO TABS
20.0000 mg | ORAL_TABLET | Freq: Every day | ORAL | Status: DC
  Administered 2022-12-25 – 2022-12-26 (×2): 20 mg via ORAL
  Filled 2022-12-25 (×2): qty 1

## 2022-12-25 MED ORDER — SODIUM CHLORIDE 0.9 % IV SOLN: INTRAVENOUS | Status: DC

## 2022-12-25 MED ORDER — INSULIN GLARGINE-YFGN 100 UNIT/ML ~~LOC~~ SOLN
5.0000 [IU] | Freq: Every day | SUBCUTANEOUS | Status: DC
  Administered 2022-12-25 – 2022-12-26 (×2): 5 [IU] via SUBCUTANEOUS
  Filled 2022-12-25 (×4): qty 0.05

## 2022-12-25 MED ORDER — METOPROLOL TARTRATE 5 MG/5ML IV SOLN: 5.0000 mg | Freq: Four times a day (QID) | INTRAVENOUS | Status: DC | PRN

## 2022-12-25 MED ORDER — ENOXAPARIN SODIUM 60 MG/0.6ML IJ SOSY
50.0000 mg | PREFILLED_SYRINGE | INTRAMUSCULAR | Status: DC
  Administered 2022-12-25 – 2022-12-29 (×5): 50 mg via SUBCUTANEOUS
  Filled 2022-12-25 (×5): qty 0.6

## 2022-12-25 MED ORDER — ONDANSETRON HCL 4 MG PO TABS: 4.0000 mg | ORAL_TABLET | Freq: Four times a day (QID) | ORAL | Status: DC | PRN

## 2022-12-25 MED ORDER — ONDANSETRON HCL 4 MG/2ML IJ SOLN: 4.0000 mg | Freq: Four times a day (QID) | INTRAMUSCULAR | Status: DC | PRN

## 2022-12-25 MED ORDER — VANCOMYCIN HCL 1500 MG/300ML IV SOLN
1500.0000 mg | INTRAVENOUS | Status: AC
  Administered 2022-12-26 – 2022-12-28 (×3): 1500 mg via INTRAVENOUS
  Filled 2022-12-25 (×3): qty 300

## 2022-12-25 MED ORDER — SODIUM CHLORIDE 0.9 % IV SOLN
2.0000 g | INTRAVENOUS | Status: AC
  Administered 2022-12-26 – 2022-12-28 (×3): 2 g via INTRAVENOUS
  Filled 2022-12-25 (×3): qty 20

## 2022-12-25 NOTE — ED Notes (Signed)
Pt ambulatory without assistance.  

## 2022-12-25 NOTE — Assessment & Plan Note (Addendum)
62 year old male presenting with worsening right toe redness/pain and swelling from blister meeting sepsis criteria -admit to med-surg -sepsis criteria with fever, leukocytosis and tachycardia -check lactic acid -received IVF, continue overnight  -obtain BC -received vanc/rocephin in ED, add on flagyl with uncontrolled T2DM -check uric acid/inflammatory markers -appears more cellulitic in nature then OM -xray with no osseus abnormalities -MRI foot pending -ortho consulted by EDP, will see tomorrow but would get MRI back before to make sure even need them on board

## 2022-12-25 NOTE — Progress Notes (Signed)
A consult was received from an ED physician for vancomycin per pharmacy dosing.  The patient's profile has been reviewed for ht/wt/allergies/indication/available labs.    A one time order has been placed for vancomycin 1750 mg IV.    Further antibiotics/pharmacy consults should be ordered by admitting physician if indicated.                         Thank you for allowing pharmacy to be a part of this patient's care.  Selinda Eon, PharmD, BCPS Clinical Pharmacist [place] Please utilize Amion for appropriate phone number to reach the unit pharmacist [facility]) 12/25/2022 6:42 PM

## 2022-12-25 NOTE — ED Provider Notes (Signed)
[place] EMERGENCY DEPARTMENT AT [facility] Provider Note  CSN: [id number] Arrival date & time: 12/25/22 1605  Chief Complaint(s) Wound Infection (Here Sunday with a piece of metal in his toe. Metal removed and sent home with antibiotics. )  HPI FRITZ LICEA is a 62 y.o. male with history of hypertension, diabetes not on any diabetic medications presenting to the emergency department for right foot pain.  He was in the emergency department 2 days ago with wound of the foot.  Did pull out a metal fragment at the site of the wound.  Had x-rays without evidence of osteomyelitis and was discharged on Keflex.  He reports he has been compliant with his Keflex but he has had continued pain.  Has tried to schedule follow-up with a podiatrist but has not been able to see them yet.  No fevers or chills but just reports continued pain and swelling without any improvement.  No other new symptoms.  No new trauma.   Past Medical History Past Medical History:  Diagnosis Date   Borderline hypertension    Diabetes (HCC)    diagnosed 2017   Diabetes mellitus type 2, controlled, without complications (HCC) 05/09/2016   Environmental allergies    Hypertension    Sleep apnea    Patient Active Problem List   Diagnosis Date Noted   Sepsis from diabetic infection of right foot (HCC) 12/25/2022   Sepsis (HCC) 12/25/2022   Essential hypertension 12/25/2022   Uncontrolled type 2 diabetes mellitus with hyperglycemia (HCC) 05/09/2016   Dyspnea 04/18/2016   Obstructive sleep apnea 03/19/2014   Home Medication(s) Prior to Admission medications   Medication Sig Start Date End Date Taking? Authorizing Provider  cephALEXin (KEFLEX) 500 MG capsule Take 1 capsule (500 mg total) by mouth 4 (four) times daily for 7 days. 12/23/22 12/30/22  Tegeler, Canary Brim, MD  hydrochlorothiazide (HYDRODIURIL) 25 MG tablet Take 1 tablet (25 mg total) by mouth daily. 12/23/22   Tegeler, Canary Brim, MD                                                                                                                                     Past Surgical History Past Surgical History:  Procedure Laterality Date   SHOULDER SURGERY  1981   WISDOM TOOTH EXTRACTION     Family History Family History  Problem Relation Age of Onset   Lung cancer Mother        died at 48   Diabetes Mother    Other Father        blood disease-ETOH abuse   Cirrhosis Father    Diabetes Brother    Diabetes Brother    Diabetes Brother     Social History Social History   Tobacco Use   Smoking status: Never   Smokeless tobacco: Never  Substance Use Topics   Alcohol use: No   Drug use: No   Allergies  Codeine  Review of Systems Review of Systems  All other systems reviewed and are negative.   Physical Exam Vital Signs  I have reviewed the triage vital signs BP (!) 188/99   Pulse 97   Temp (!) 100.9 F (38.3 C) (Oral) Comment: removed blanket, Paramedic notified  Resp 18   Ht 5\' 11"  (1.803 m)   Wt 101.2 kg   SpO2 100%   BMI 31.12 kg/m  Physical Exam Vitals and nursing note reviewed.  Constitutional:      General: He is not in acute distress.    Appearance: Normal appearance.  HENT:     Mouth/Throat:     Mouth: Mucous membranes are moist.  Eyes:     Conjunctiva/sclera: Conjunctivae normal.  Cardiovascular:     Rate and Rhythm: Normal rate and regular rhythm.  Pulmonary:     Effort: Pulmonary effort is normal. No respiratory distress.     Breath sounds: Normal breath sounds.  Abdominal:     General: Abdomen is flat.     Palpations: Abdomen is soft.     Tenderness: There is no abdominal tenderness.  Musculoskeletal:     Right lower leg: No edema.     Left lower leg: No edema.  Skin:    General: Skin is warm and dry.     Capillary Refill: Capillary refill takes less than 2 seconds.     Comments: Large area of warmth, swelling and tenderness over the right hallux medially with some expressible  purulence  Neurological:     Mental Status: He is alert and oriented to person, place, and time. Mental status is at baseline.  Psychiatric:        Mood and Affect: Mood normal.        Behavior: Behavior normal.     ED Results and Treatments Labs (all labs ordered are listed, but only abnormal results are displayed) Labs Reviewed  CBC WITH DIFFERENTIAL/PLATELET - Abnormal; Notable for the following components:      Result Value   WBC 12.6 (*)    Neutro Abs 10.3 (*)    All other components within normal limits  COMPREHENSIVE METABOLIC PANEL - Abnormal; Notable for the following components:   Sodium 133 (*)    Glucose, Bld 341 (*)    BUN 27 (*)    Total Protein 8.3 (*)    Total Bilirubin 1.4 (*)    All other components within normal limits  CULTURE, BLOOD (ROUTINE X 2)  CULTURE, BLOOD (ROUTINE X 2)  SEDIMENTATION RATE  C-REACTIVE PROTEIN  LACTIC ACID, PLASMA  LACTIC ACID, PLASMA  HEMOGLOBIN A1C  URIC ACID  HIV ANTIBODY (ROUTINE TESTING W REFLEX)  BASIC METABOLIC PANEL  CBC  LIPID PANEL                                                                                                                          Radiology DG Foot Complete Right  Result Date: 12/25/2022 CLINICAL DATA:  Foot pain. Recently had metal in the foot removed. Worsening swelling of the toe. EXAM: RIGHT FOOT COMPLETE - 3+ VIEW COMPARISON:  12/23/2022 FINDINGS: Degenerative changes in the first metatarsal-phalangeal joint. 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 No evidence of acute fracture or dislocation. No focal bone lesion or bone destruction. Mild soft tissue swelling suggested over the plantar aspect of the right first toe. No radiopaque foreign bodies or soft tissue gas identified. IMPRESSION: Soft tissue swelling over the right first toe. No radiopaque foreign bodies. No acute bony abnormalities. Electronically Signed   By: Burman Nieves M.D.   On: 12/25/2022 19:02    Pertinent labs &  imaging results that were available during my care of the patient were reviewed by me and considered in my medical decision making (see MDM for details).  Medications Ordered in ED Medications  vancomycin (VANCOREADY) IVPB 1750 mg/350 mL (1,750 mg Intravenous New Bag/Given 12/25/22 2029)  insulin aspart (novoLOG) injection 0-15 Units (has no administration in time range)  metroNIDAZOLE (FLAGYL) IVPB 500 mg (has no administration in time range)  cefTRIAXone (ROCEPHIN) 2 g in sodium chloride 0.9 % 100 mL IVPB (has no administration in time range)  acetaminophen (TYLENOL) tablet 650 mg (has no administration in time range)  vancomycin (VANCOREADY) IVPB 1500 mg/300 mL (has no administration in time range)  enoxaparin (LOVENOX) injection 40 mg (has no administration in time range)  0.9 %  sodium chloride infusion (has no administration in time range)  HYDROcodone-acetaminophen (NORCO/VICODIN) 5-325 MG per tablet 1-2 tablet (has no administration in time range)  ondansetron (ZOFRAN) tablet 4 mg (has no administration in time range)    Or  ondansetron (ZOFRAN) injection 4 mg (has no administration in time range)  metoprolol tartrate (LOPRESSOR) injection 5 mg (has no administration in time range)  insulin glargine-yfgn (SEMGLEE) injection 5 Units (has no administration in time range)  lisinopril (ZESTRIL) tablet 20 mg (has no administration in time range)  sodium chloride 0.9 % bolus 1,000 mL (0 mLs Intravenous Stopped 12/25/22 2029)  cefTRIAXone (ROCEPHIN) 2 g in sodium chloride 0.9 % 100 mL IVPB (0 g Intravenous Stopped 12/25/22 1955)                                                                                                                                     Procedures Procedures  (including critical care time)  Medical Decision Making / ED Course   MDM:  62 year old male presenting to the emergency department wound infection.  History concerning for diabetic foot infection.  Patient  trialed on Keflex without improvement.  He is having continued swelling and pain.  No signs of systemic infection.  He is afebrile not tachycardic.  He is not having any fevers or chills.  Given failure of outpatient antibiotics will likely need admission.  He has he has an appoint with podiatrist will consult podiatrist and have them hopefully see him  tomorrow in the hospital.  Will obtain x-ray to evaluate for signs of osteomyelitis but probably will need to obtain MRI foot while inpatient.  Clinical Course as of 12/25/22 2127  Tue Dec 25, 2022  3086 Discussed with Dr. Ave Filter who will consult. Orthopedics needs to consult per podiatry since they haven't seen him as an outpatient yet  [WS]  2055 Discussed with Dr. Artis Flock who will admit the patient.  Patient noted to have fever 10 minutes ago.  She has a additionally added on cultures. [WS]    Clinical Course User Index [WS] Lonell Grandchild, MD     Additional history obtained:  -External records from outside source obtained and reviewed including: Chart review including previous notes, labs, imaging, consultation notes including previous ER visit for similar few days ago   Lab Tests: -I ordered, reviewed, and interpreted labs.   The pertinent results include:   Labs Reviewed  CBC WITH DIFFERENTIAL/PLATELET - Abnormal; Notable for the following components:      Result Value   WBC 12.6 (*)    Neutro Abs 10.3 (*)    All other components within normal limits  COMPREHENSIVE METABOLIC PANEL - Abnormal; Notable for the following components:   Sodium 133 (*)    Glucose, Bld 341 (*)    BUN 27 (*)    Total Protein 8.3 (*)    Total Bilirubin 1.4 (*)    All other components within normal limits  CULTURE, BLOOD (ROUTINE X 2)  CULTURE, BLOOD (ROUTINE X 2)  SEDIMENTATION RATE  C-REACTIVE PROTEIN  LACTIC ACID, PLASMA  LACTIC ACID, PLASMA  HEMOGLOBIN A1C  URIC ACID  HIV ANTIBODY (ROUTINE TESTING W REFLEX)  BASIC METABOLIC PANEL  CBC   LIPID PANEL    Notable for leukocytosis   Imaging Studies ordered: I ordered imaging studies including XR foot On my interpretation imaging demonstrates no acute process I independently visualized and interpreted imaging. I agree with the radiologist interpretation   Medicines ordered and prescription drug management: Meds ordered this encounter  Medications   sodium chloride 0.9 % bolus 1,000 mL   cefTRIAXone (ROCEPHIN) 2 g in sodium chloride 0.9 % 100 mL IVPB    Order Specific Question:   Antibiotic Indication:    Answer:   Osteomyelitis   vancomycin (VANCOREADY) IVPB 1750 mg/350 mL    Order Specific Question:   Indication:    Answer:   Wound Infection   insulin aspart (novoLOG) injection 0-15 Units    Order Specific Question:   Correction coverage:    Answer:   Moderate (average weight, post-op)    Order Specific Question:   CBG < 70:    Answer:   Implement Hypoglycemia Standing Orders and refer to Hypoglycemia Standing Orders sidebar report    Order Specific Question:   CBG 70 - 120:    Answer:   0 units    Order Specific Question:   CBG 121 - 150:    Answer:   2 units    Order Specific Question:   CBG 151 - 200:    Answer:   3 units    Order Specific Question:   CBG 201 - 250:    Answer:   5 units    Order Specific Question:   CBG 251 - 300:    Answer:   8 units    Order Specific Question:   CBG 301 - 350:    Answer:   11 units    Order Specific Question:  CBG 351 - 400:    Answer:   15 units    Order Specific Question:   CBG > 400    Answer:   call MD and obtain STAT lab verification   metroNIDAZOLE (FLAGYL) IVPB 500 mg    Order Specific Question:   Antibiotic Indication:    Answer:   Other Indication (list below)    Order Specific Question:   Other Indication:    Answer:   diabetic foot infection   cefTRIAXone (ROCEPHIN) 2 g in sodium chloride 0.9 % 100 mL IVPB    Order Specific Question:   Antibiotic Indication:    Answer:   Osteomyelitis   acetaminophen  (TYLENOL) tablet 650 mg   vancomycin (VANCOREADY) IVPB 1500 mg/300 mL    Order Specific Question:   Indication:    Answer:   Wound Infection   enoxaparin (LOVENOX) injection 40 mg   0.9 %  sodium chloride infusion   HYDROcodone-acetaminophen (NORCO/VICODIN) 5-325 MG per tablet 1-2 tablet   OR Linked Order Group    ondansetron (ZOFRAN) tablet 4 mg    ondansetron (ZOFRAN) injection 4 mg   metoprolol tartrate (LOPRESSOR) injection 5 mg   insulin glargine-yfgn (SEMGLEE) injection 5 Units   lisinopril (ZESTRIL) tablet 20 mg    -I have reviewed the patients home medicines and have made adjustments as needed   Consultations Obtained: I requested consultation with the orthopedist Dr. Ave Filter,  and discussed lab and imaging findings as well as pertinent plan - they recommend: he will consult and see patient tomorrow   Cardiac Monitoring: The patient was maintained on a cardiac monitor.  I personally viewed and interpreted the cardiac monitored which showed an underlying rhythm of: NSR  Reevaluation: After the interventions noted above, I reevaluated the patient and found that their symptoms have improved  Co morbidities that complicate the patient evaluation  Past Medical History:  Diagnosis Date   Borderline hypertension    Diabetes (HCC)    diagnosed 2017   Diabetes mellitus type 2, controlled, without complications (HCC) 05/09/2016   Environmental allergies    Hypertension    Sleep apnea       Dispostion: Disposition decision including need for hospitalization was considered, and patient admitted to the hospital.    Final Clinical Impression(s) / ED Diagnoses Final diagnoses:  Diabetic foot infection (HCC)     This chart was dictated using voice recognition software.  Despite best efforts to proofread,  errors can occur which can change the documentation meaning.    Lonell Grandchild, MD 12/25/22 2127

## 2022-12-25 NOTE — ED Notes (Signed)
ED TO INPATIENT HANDOFF REPORT  Name/Age/Gender Pedro Moyer 62 y.o. male  Code Status   Home/SNF/Other Home  Chief Complaint Diabetic infection of right foot (HCC) [Z61.096, L08.9]  Level of Care/Admitting Diagnosis ED Disposition     ED Disposition  Admit   Condition  --   Comment  Hospital Area: [facility] [[id number]]  Level of Care: Med-Surg [16]  May admit patient to [facility] or [facility] if equivalent level of care is available:: Yes  Covid Evaluation: Asymptomatic - no recent exposure (last 10 days) testing not required  Diagnosis: Diabetic infection of right foot [facility]) [[id number]]  Admitting Physician: Orland Mustard [[id number]]  Attending Physician: Orland Mustard [phone]  Certification:: I certify this patient will need inpatient services for at least 2 midnights  Estimated Length of Stay: 3          Medical History Past Medical History:  Diagnosis Date   Borderline hypertension    Diabetes (HCC)    diagnosed 2017   Diabetes mellitus type 2, controlled, without complications (HCC) 05/09/2016   Environmental allergies    Hypertension    Sleep apnea     Allergies Allergies  Allergen Reactions   Codeine Palpitations    IV Location/Drains/Wounds Patient Lines/Drains/Airways Status     Active Line/Drains/Airways     Name Placement date Placement time Site Days   Peripheral IV 12/25/22 20 G 1" Left;Posterior Forearm 12/25/22  1931  Forearm  less than 1            Labs/Imaging Results for orders placed or performed during the hospital encounter of 12/25/22 (from the past 48 hour(s))  CBC with Differential     Status: Abnormal   Collection Time: 12/25/22  7:29 PM  Result Value Ref Range   WBC 12.6 (H) 4.0 - 10.5 K/uL   RBC 5.42 4.22 - 5.81 MIL/uL   Hemoglobin 15.5 13.0 - 17.0 g/dL   HCT 30.8 65.7 - 84.6 %   MCV 82.1 80.0 - 100.0 fL   MCH 28.6 26.0 - 34.0 pg   MCHC 34.8 30.0 - 36.0 g/dL   RDW 96.2 95.2 - 84.1 %    Platelets 254 150 - 400 K/uL   nRBC 0.0 0.0 - 0.2 %   Neutrophils Relative % 82 %   Neutro Abs 10.3 (H) 1.7 - 7.7 K/uL   Lymphocytes Relative 10 %   Lymphs Abs 1.2 0.7 - 4.0 K/uL   Monocytes Relative 7 %   Monocytes Absolute 0.9 0.1 - 1.0 K/uL   Eosinophils Relative 1 %   Eosinophils Absolute 0.1 0.0 - 0.5 K/uL   Basophils Relative 0 %   Basophils Absolute 0.1 0.0 - 0.1 K/uL   Immature Granulocytes 0 %   Abs Immature Granulocytes 0.04 0.00 - 0.07 K/uL    Comment: Performed at [facility], [address] [place], [place] [address]  Comprehensive metabolic panel     Status: Abnormal   Collection Time: 12/25/22  7:29 PM  Result Value Ref Range   Sodium 133 (L) 135 - 145 mmol/L   Potassium 3.9 3.5 - 5.1 mmol/L   Chloride 98 98 - 111 mmol/L   CO2 25 22 - 32 mmol/L   Glucose, Bld 341 (H) 70 - 99 mg/dL    Comment: Glucose reference range applies only to samples taken after fasting for at least 8 hours.   BUN 27 (H) 8 - 23 mg/dL   Creatinine, Ser 1.02 0.61 - 1.24  mg/dL   Calcium 9.4 8.9 - 16.1 mg/dL   Total Protein 8.3 (H) 6.5 - 8.1 g/dL   Albumin 4.0 3.5 - 5.0 g/dL   AST 18 15 - 41 U/L   ALT 27 0 - 44 U/L   Alkaline Phosphatase 89 38 - 126 U/L   Total Bilirubin 1.4 (H) 0.3 - 1.2 mg/dL   GFR, Estimated >09 >60 mL/min    Comment: (NOTE) Calculated using the CKD-EPI Creatinine Equation (2021)    Anion gap 10 5 - 15    Comment: Performed at [facility], [address], [place], [place] [address]   DG Foot Complete Right  Result Date: 12/25/2022 CLINICAL DATA:  Foot pain. Recently had metal in the foot removed. Worsening swelling of the toe. EXAM: RIGHT FOOT COMPLETE - 3+ VIEW COMPARISON:  12/23/2022 FINDINGS: Degenerative changes in the first metatarsal-phalangeal joint. 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 No evidence of acute fracture or dislocation. No focal bone lesion or bone destruction. Mild soft tissue swelling suggested  over the plantar aspect of the right first toe. No radiopaque foreign bodies or soft tissue gas identified. IMPRESSION: Soft tissue swelling over the right first toe. No radiopaque foreign bodies. No acute bony abnormalities. Electronically Signed   By: Burman Nieves M.D.   On: 12/25/2022 19:02    Pending Labs Unresulted Labs (From admission, onward)     Start     Ordered   12/25/22 2059  Hemoglobin A1c  Once,   R       Comments: To assess prior glycemic control    12/25/22 2058   12/25/22 2054  Lactic acid, plasma  STAT Now then every 3 hours,   R      12/25/22 2053   12/25/22 2054  Culture, blood (Routine X 2) w Reflex to ID Panel  BLOOD CULTURE X 2,   R      12/25/22 2053   12/25/22 1820  Sedimentation rate  Once,   URGENT        12/25/22 1819   12/25/22 1820  C-reactive protein  Once,   URGENT        12/25/22 1819            Vitals/Pain Today's Vitals   12/25/22 1616 12/25/22 1628 12/25/22 1933 12/25/22 2043  BP: (!) 188/99     Pulse:      Resp:      Temp:    (!) 100.9 F (38.3 C)  TempSrc:    Oral  SpO2:   100%   Weight:  223 lb 1.7 oz (101.2 kg)    Height:  5\' 11"  (1.803 m)    PainSc:  5       Isolation Precautions No active isolations  Medications Medications  vancomycin (VANCOREADY) IVPB 1750 mg/350 mL (1,750 mg Intravenous New Bag/Given 12/25/22 2029)  insulin aspart (novoLOG) injection 0-15 Units (has no administration in time range)  metroNIDAZOLE (FLAGYL) IVPB 500 mg (has no administration in time range)  cefTRIAXone (ROCEPHIN) 2 g in sodium chloride 0.9 % 100 mL IVPB (has no administration in time range)  acetaminophen (TYLENOL) tablet 650 mg (has no administration in time range)  sodium chloride 0.9 % bolus 1,000 mL (0 mLs Intravenous Stopped 12/25/22 2029)  cefTRIAXone (ROCEPHIN) 2 g in sodium chloride 0.9 % 100 mL IVPB (0 g Intravenous Stopped 12/25/22 1955)    Mobility walks

## 2022-12-25 NOTE — Assessment & Plan Note (Signed)
Continue cpap at night Brought his own from home

## 2022-12-25 NOTE — ED Triage Notes (Signed)
Patient arrived POV. Patient reports Here Sunday with a piece of metal in his toe. Metal removed and sent home with antibiotics. Toe getting worse, swelling. AAOX4, respirations even and unlabored. NAD.

## 2022-12-25 NOTE — Progress Notes (Signed)
Pharmacy Antibiotic Note  Pedro Moyer is a 62 y.o. male admitted on 12/25/2022 with  wound infection .  Rocephin 2 g IV q24h and Flagyl 500 mg IV q12h ordered by provider.  In addition, Pharmacy has been consulted for vancomycin dosing.  In ED, vancomycin 1750 mg x 1 administered 6/4 20:29  Plan: Continue Rocephin and Flagyl per provider Continue vancomycin 1500 mg IV every 24 hours (Goal AUC 400-550, eAUC 470.3, SCr used: 1.14, Vd used: 0.5) Monitor clinical progress, renal function, vancomycin levels as indicated F/U C&S, abx deescalation / LOT   Height: 5\' 11"  (180.3 cm) Weight: 101.2 kg (223 lb 1.7 oz) IBW/kg (Calculated) : 75.3  Temp (24hrs), Avg:99.8 F (37.7 C), Min:98.6 F (37 C), Max:100.9 F (38.3 C)  Recent Labs  Lab 12/23/22 1236 12/23/22 1503 12/23/22 1648 12/25/22 1929  WBC 12.3*  --   --  12.6*  CREATININE 1.06  --   --  1.14  LATICACIDVEN  --  1.2 0.9  --     Estimated Creatinine Clearance: 80.4 mL/min (by C-G formula based on SCr of 1.14 mg/dL).    Allergies  Allergen Reactions   Codeine Palpitations    Antimicrobials this admission: 6/4 Rocephin >>  6/4 Flagyl >>  6/4 vancomycin >>  Microbiology results: 6/4 BCx: sent   Thank you for allowing pharmacy to be a part of this patient's care.  Lynden Ang, PharmD, BCPS 12/25/2022 9:02 PM

## 2022-12-25 NOTE — H&P (Signed)
History and Physical    Patient: Pedro Moyer:[id number] DOB: 05-May-1961 DOA: 12/25/2022 DOS: the patient was seen and examined on 12/25/2022 PCP: Pcp, No  Patient coming from: Home - lives with his 2 sons.    Chief Complaint: worsening toe pain/swelling   HPI: [name] is a 62 y.o. male with medical history significant of T2DM, HTN, OSA, who presented to ED with worsening toe swelling, redness and pain. He states about 2 weeks ago he walked 31 miles at [employer] and had blisters on his right big toe. While he was there on the last day he popped a blister and put some antibiotic cream on the blister. He was dressing it everyday. On this past Friday he saw a metal shaving in his callous and got it out. On Sunday his toe started to get red and went to ED where he was given keflex and a tdap vaccination. Since Sunday his wound has become more swollen and red and painful.  He denies any drainage. No fever/chills at home. No N/V.   He is not seeing anyone for his diabetes. He lost 80 pounds a few years and was able to come off medication. He is on no medication and not followed by any physician.   On Sunday the ED doc put him on HCTZ for his uncontrolled blood pressure as well.     Denies any fever/chills, vision changes/headaches, chest pain or palpitations, shortness of breath or cough, abdominal pain, N/V/D, dysuria or leg swelling.    He does not smoke or drink alcohol.   ER Course:  vitals: temp 100.9, bp: 188/99, HR: 97, RR: 18, oxygen: 100%RA Pertinent labs: wbc: 12.6, BUN: 27, glucose: 341,  Right foot xray: soft tissue swelling over the first right toe. No acute bony findings.  In ED: started on vanc/rocephin and given 1L IVF bolus ortho/podiatry consulted. TRH asked to admit. MRI also ordered    Review of Systems: As mentioned in the history of present illness. All other systems reviewed and are negative. Past Medical History:  Diagnosis Date   Borderline  hypertension    Diabetes (HCC)    diagnosed 2017   Diabetes mellitus type 2, controlled, without complications (HCC) 05/09/2016   Environmental allergies    Hypertension    Sleep apnea    Past Surgical History:  Procedure Laterality Date   SHOULDER SURGERY  1981   WISDOM TOOTH EXTRACTION     Social History:  reports that he has never smoked. He has never used smokeless tobacco. He reports that he does not drink alcohol and does not use drugs.  Allergies  Allergen Reactions   Codeine Palpitations    Family History  Problem Relation Age of Onset   Lung cancer Mother        died at 79   Diabetes Mother    Other Father        blood disease-ETOH abuse   Cirrhosis Father    Diabetes Brother    Diabetes Brother    Diabetes Brother     Prior to Admission medications   Medication Sig Start Date End Date Taking? Authorizing Provider  cephALEXin (KEFLEX) 500 MG capsule Take 1 capsule (500 mg total) by mouth 4 (four) times daily for 7 days. 12/23/22 12/30/22  Tegeler, Canary Brim, MD  hydrochlorothiazide (HYDRODIURIL) 25 MG tablet Take 1 tablet (25 mg total) by mouth daily. 12/23/22   Tegeler, Canary Brim, MD    Physical Exam: Vitals:  12/25/22 1616 12/25/22 1628 12/25/22 1933 12/25/22 2043  BP: (!) 188/99     Pulse:      Resp:      Temp:    (!) 100.9 F (38.3 C)  TempSrc:    Oral  SpO2:   100%   Weight:  101.2 kg    Height:  5\' 11"  (1.803 m)     General:  Appears calm and comfortable and is in NAD Eyes:  PERRL, EOMI, normal lids, iris ENT:  grossly normal hearing, lips & tongue, mmm; appropriate dentition Neck:  no LAD, masses or thyromegaly; no carotid bruits Cardiovascular:  RRR, no m/r/g. No LE edema.  Respiratory:   CTA bilaterally with no wheezes/rales/rhonchi.  Normal respiratory effort. Abdomen:  soft, NT, ND, NABS Back:   normal alignment, no CVAT Skin:  no rash or induration seen on limited exam Musculoskeletal:  grossly normal tone BUE/BLE, good ROM, no bony  abnormality Lower extremity:  pedal pulses 2+ bilaterally. Left foot with no ulcers/lesions. Right foot with erythema and edema of great toe spreading superiorly, tenderness with movement and large callous with healed blister below this. No open areas/drainage.   Psychiatric:  grossly normal mood and affect, speech fluent and appropriate, AOx3 Neurologic:  CN 2-12 grossly intact, moves all extremities in coordinated fashion, sensation intact   Radiological Exams on Admission: Independently reviewed - see discussion in A/P where applicable  DG Foot Complete Right  Result Date: 12/25/2022 CLINICAL DATA:  Foot pain. Recently had metal in the foot removed. Worsening swelling of the toe. EXAM: RIGHT FOOT COMPLETE - 3+ VIEW COMPARISON:  12/23/2022 FINDINGS: Degenerative changes in the first metatarsal-phalangeal joint. 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 No evidence of acute fracture or dislocation. No focal bone lesion or bone destruction. Mild soft tissue swelling suggested over the plantar aspect of the right first toe. No radiopaque foreign bodies or soft tissue gas identified. IMPRESSION: Soft tissue swelling over the right first toe. No radiopaque foreign bodies. No acute bony abnormalities. Electronically Signed   By: Burman Nieves M.D.   On: 12/25/2022 19:02    EKG: pending   Labs on Admission: I have personally reviewed the available labs and imaging studies at the time of the admission.  Pertinent labs:   wbc: 12.6,  BUN: 27,  glucose: 341,   Assessment and Plan: Principal Problem:   Sepsis from diabetic infection of right foot (HCC) Active Problems:   Uncontrolled type 2 diabetes mellitus with hyperglycemia (HCC)   Essential hypertension   Obstructive sleep apnea   Sepsis (HCC)    Assessment and Plan: * Sepsis from diabetic infection of right foot (HCC) 62 year old male presenting with worsening right toe redness/pain and swelling from blister meeting sepsis  criteria -admit to med-surg -sepsis criteria with fever, leukocytosis and tachycardia -check lactic acid -received IVF, continue overnight  -obtain BC -received vanc/rocephin in ED, add on flagyl with uncontrolled T2DM -check uric acid/inflammatory markers -appears more cellulitic in nature then OM -xray with no osseus abnormalities -MRI foot pending -ortho consulted by EDP, will see tomorrow but would get MRI back before to make sure even need them on board   Uncontrolled type 2 diabetes mellitus with hyperglycemia (HCC) Uncontrolled diabetes. Apparently lost 80 pounds a few years ago and came off medication then has not followed up with a PCP -check A1C -start long acting insulin, 5 units and titrate -will need oral agents and close f/u with PCP on d/c  -start  lisinopril for nephro protection and blood pressure control  -check lipid panel in AM, will need statin -SSI and accuchecks QAC/HS  -SW consult for PCP   Essential hypertension Stop hctz and will start him on lisinopril with his uncontrolled diabetes for HTN and nephro protection Start at 20mg  daily and titrate as indicated with PCP  Side effects of ACE-I discussed including dry cough and angioedema.  Metoprolol IV Prn  SW consult for PCP   Obstructive sleep apnea Continue cpap at night Brought his own from home      Advance Care Planning:   Code Status: Full Code   Consults: ortho: Dr. Ave Filter   DVT Prophylaxis: lovenox   Family Communication: none   Severity of Illness: The appropriate patient status for this patient is INPATIENT. Inpatient status is judged to be reasonable and necessary in order to provide the required intensity of service to ensure the patient's safety. The patient's presenting symptoms, physical exam findings, and initial radiographic and laboratory data in the context of their chronic comorbidities is felt to place them at high risk for further clinical deterioration. Furthermore, it is  not anticipated that the patient will be medically stable for discharge from the hospital within 2 midnights of admission.   * I certify that at the point of admission it is my clinical judgment that the patient will require inpatient hospital care spanning beyond 2 midnights from the point of admission due to high intensity of service, high risk for further deterioration and high frequency of surveillance required.*  Author: Orland Mustard, MD 12/25/2022 9:43 PM  For on call review [email or web address].

## 2022-12-25 NOTE — Assessment & Plan Note (Addendum)
Stop hctz and will start him on lisinopril with his uncontrolled diabetes for HTN and nephro protection Start at 20mg  daily and titrate as indicated with PCP  Side effects of ACE-I discussed including dry cough and angioedema.  Metoprolol IV Prn  SW consult for PCP

## 2022-12-25 NOTE — Assessment & Plan Note (Addendum)
Uncontrolled diabetes. Apparently lost 80 pounds a few years ago and came off medication then has not followed up with a PCP -check A1C -start long acting insulin, 5 units and titrate -will need oral agents and close f/u with PCP on d/c  -start lisinopril for nephro protection and blood pressure control  -check lipid panel in AM, will need statin -SSI and accuchecks QAC/HS  -SW consult for PCP

## 2022-12-25 NOTE — Progress Notes (Signed)
   12/25/22 2333  BiPAP/CPAP/SIPAP  BiPAP/CPAP/SIPAP Pt Type Adult (pt declined assistance with home cpap.  Pt comfortable/prefers self-placement.)  BiPAP/CPAP/SIPAP Resmed  Mask Type Nasal mask (mask from home)  EPAP 10 cmH2O  FiO2 (%) 21 %  Heater Temperature  (sterile water added to humidifier)  Patient Home Equipment Yes  Auto Titrate No  Safety Check Completed by RT for Home Unit Yes, no issues noted (no frays on cord noted, machine plugged into red outlet)

## 2022-12-26 ENCOUNTER — Encounter (HOSPITAL_COMMUNITY): Payer: Self-pay | Admitting: Family Medicine

## 2022-12-26 DIAGNOSIS — E1165 Type 2 diabetes mellitus with hyperglycemia: Secondary | ICD-10-CM

## 2022-12-26 DIAGNOSIS — M6701 Short Achilles tendon (acquired), right ankle: Secondary | ICD-10-CM

## 2022-12-26 DIAGNOSIS — E11628 Type 2 diabetes mellitus with other skin complications: Secondary | ICD-10-CM | POA: Diagnosis not present

## 2022-12-26 DIAGNOSIS — L089 Local infection of the skin and subcutaneous tissue, unspecified: Secondary | ICD-10-CM | POA: Diagnosis not present

## 2022-12-26 DIAGNOSIS — M869 Osteomyelitis, unspecified: Secondary | ICD-10-CM

## 2022-12-26 LAB — LACTIC ACID, PLASMA: Lactic Acid, Venous: 1 mmol/L (ref 0.5–1.9)

## 2022-12-26 LAB — BASIC METABOLIC PANEL
Anion gap: 11 (ref 5–15)
BUN: 21 mg/dL (ref 8–23)
CO2: 24 mmol/L (ref 22–32)
Calcium: 8.5 mg/dL — ABNORMAL LOW (ref 8.9–10.3)
Chloride: 97 mmol/L — ABNORMAL LOW (ref 98–111)
Creatinine, Ser: 1.08 mg/dL (ref 0.61–1.24)
GFR, Estimated: >60 mL/min (ref >60)
Glucose, Bld: 310 mg/dL — ABNORMAL HIGH (ref 70–99)
Potassium: 3.9 mmol/L (ref 3.5–5.1)
Sodium: 132 mmol/L — ABNORMAL LOW (ref 135–145)

## 2022-12-26 LAB — URIC ACID: Uric Acid, Serum: 4.3 mg/dL (ref 3.7–8.6)

## 2022-12-26 LAB — GLUCOSE, CAPILLARY
Glucose-Capillary: 251 mg/dL — ABNORMAL HIGH (ref 70–99)
Glucose-Capillary: 218 mg/dL — ABNORMAL HIGH (ref 70–99)
Glucose-Capillary: 192 mg/dL — ABNORMAL HIGH (ref 70–99)
Glucose-Capillary: 222 mg/dL — ABNORMAL HIGH (ref 70–99)
Glucose-Capillary: 237 mg/dL — ABNORMAL HIGH (ref 70–99)

## 2022-12-26 LAB — CBC
HCT: 40.7 % (ref 39.0–52.0)
Hemoglobin: 14.3 g/dL (ref 13.0–17.0)
MCH: 29.1 pg (ref 26.0–34.0)
MCHC: 35.1 g/dL (ref 30.0–36.0)
MCV: 82.9 fL (ref 80.0–100.0)
Platelets: 240 10*3/uL (ref 150–400)
RBC: 4.91 MIL/uL (ref 4.22–5.81)
RDW: 13.2 % (ref 11.5–15.5)
WBC: 11.5 10*3/uL — ABNORMAL HIGH (ref 4.0–10.5)
nRBC: 0 % (ref 0.0–0.2)

## 2022-12-26 LAB — LIPID PANEL
Cholesterol: 119 mg/dL (ref 0–200)
HDL: 37 mg/dL — ABNORMAL LOW (ref >40)
LDL Cholesterol: 72 mg/dL (ref 0–99)
Total CHOL/HDL Ratio: 3.2 RATIO
Triglycerides: 52 mg/dL (ref <150)
VLDL: 10 mg/dL (ref 0–40)

## 2022-12-26 LAB — CULTURE, BLOOD (ROUTINE X 2): Special Requests: ADEQUATE

## 2022-12-26 LAB — HEMOGLOBIN A1C
Hgb A1c MFr Bld: 9.7 % — ABNORMAL HIGH (ref 4.8–5.6)
Mean Plasma Glucose: 231.69 mg/dL

## 2022-12-26 LAB — C-REACTIVE PROTEIN: CRP: 10.6 mg/dL — ABNORMAL HIGH (ref <1.0)

## 2022-12-26 MED ORDER — INSULIN ASPART 100 UNIT/ML IJ SOLN
0.0000 [IU] | Freq: Every day | INTRAMUSCULAR | Status: DC
  Administered 2022-12-26: 2 [IU] via SUBCUTANEOUS

## 2022-12-26 MED ORDER — HYDRALAZINE HCL 25 MG PO TABS
25.0000 mg | ORAL_TABLET | Freq: Four times a day (QID) | ORAL | Status: DC | PRN
  Administered 2022-12-27 – 2022-12-28 (×2): 25 mg via ORAL
  Filled 2022-12-26 (×2): qty 1

## 2022-12-26 MED ORDER — INSULIN ASPART 100 UNIT/ML IJ SOLN
0.0000 [IU] | Freq: Three times a day (TID) | INTRAMUSCULAR | Status: DC
  Administered 2022-12-26 (×2): 5 [IU] via SUBCUTANEOUS
  Administered 2022-12-26 – 2022-12-27 (×2): 8 [IU] via SUBCUTANEOUS
  Administered 2022-12-27: 3 [IU] via SUBCUTANEOUS
  Administered 2022-12-28: 5 [IU] via SUBCUTANEOUS
  Administered 2022-12-28: 3 [IU] via SUBCUTANEOUS
  Administered 2022-12-29 (×2): 5 [IU] via SUBCUTANEOUS
  Administered 2022-12-29 – 2022-12-30 (×3): 3 [IU] via SUBCUTANEOUS

## 2022-12-26 NOTE — TOC Transition Note (Signed)
Transition of Care [facility]) - CM/SW Discharge Note   Patient Details  Name: Pedro Moyer MRN: [id number] Date of Birth: 05/18/1961  Transition of [facility] CM/SW Contact:  Amada Jupiter, LCSW Phone Number: 12/26/2022, 11:04 AM   Clinical Narrative:     TOC referral place to assist with PCP.  Met with pt and daughter and confirming pt has insurance coverage.  Discussed with pt need to check for in network providers on the ins website.  He understands and states he will make his appointment.  No further TOC needs.  Final next level of care: Home/Self Care Barriers to Discharge: No Barriers Identified   Patient Goals and CMS Choice      Discharge Placement                         Discharge Plan and Services Additional resources added to the After Visit Summary for                  DME Arranged: N/A DME Agency: NA                  Social Determinants of Health (SDOH) Interventions SDOH Screenings   Food Insecurity: No Food Insecurity (12/26/2022)  Housing: Patient Declined (12/26/2022)  Transportation Needs: No Transportation Needs (12/26/2022)  Utilities: Not At Risk (12/26/2022)  Tobacco Use: Low Risk  (12/26/2022)     Readmission Risk Interventions    12/26/2022   11:03 AM  Readmission Risk Prevention Plan  Post Dischage Appt Complete  Medication Screening Complete  Transportation Screening Complete

## 2022-12-26 NOTE — Progress Notes (Signed)
PROGRESS NOTE    TRAYVOND NEUJAHR  ZOX:[id number] DOB: 01/17/1961 DOA: 12/25/2022 PCP: Pcp, No    Brief Narrative:   [name] is a 62 y.o. male with past medical history significant for type 2 diabetes mellitus, essential hypertension, OSA who presented to [facility] ED on 6/4 with progressive redness, swelling and pain to his right great toe.  Patient reports onset roughly 2 weeks ago as he was walking long distances at [employer] and subsequently developed blisters on his right big toe.  On the last day of his trip, patient popped the blister and put some antibiotic cream overlying this.  Since patient has been soaking his foot in Epsom salt.  He also went to the ED on Sunday and given a Tdap vaccination and oral Keflex.  Despite antibiotic use his symptoms continued to progress/worsen.  Denies fever/chills.  In regards to his diabetes, currently diet controlled over the last few years.  He reports that he lost 80 pounds and was able to stop his medication.  He is currently not followed by a physician and does not regularly check his blood sugars at home.  Additionally he was recently started on hydrochlorothiazide for his uncontrolled blood pressure after he was seen in the ED as well.  In the ED, temperature 100.9 F.  HR 97, RR 18, BP 188/99, SpO2 100% on room air.  WBC 12.6, hemoglobin 15.5, platelet 254.  Sodium 133, potassium 3.9, chloride 98, CO2 25, glucose 341, BUN 27, creatinine 1.14.  AST 18, ALT 27, total bilirubin 1.4.  CRP 10.6.  Lactic acid 0.5.  Right foot x-ray with soft tissue swelling over the right first toe, no acute bony abnormalities, no radiopaque foreign bodies identified.  Orthopedics was consulted.  TRH consulted for admission for further evaluation and management of diabetic foot wound.  Assessment & Plan:   Right great toe osteomyelitis/abscess/cellulitis Myositis Patient presenting to ED with progressive swelling, pain and redness to his right  great toe.  Recently developed blister after recent trip to [employer].  Patient with fever 100.9, WBC count elevated 12.6 with elevated CRP.  MR right foot with and without contrast with fluid collection plantar aspect distal phalanx of the first digit with surrounding edema and enhancement concerning for abscess and early osteomyelitis; myopathy/myositis. -- Orthopedics consulted; recommended transfer to [facility] for evaluation by Dr. Lajoyce Corners -- WBC 12.6>11.5 -- Vancomycin, pharmacy consulted for dosing/monitoring -- Ceftriaxone 2 g IV every 24 hours -- Metronidazole 5 mg IV every 12 hours -- N.p.o. after midnight in case plan for operative management tomorrow -- CBC daily  Type 2 diabetes mellitus Blood glucose elevated 341 on admission.  Currently diet controlled at baseline.  Previously on metformin. -- Diabetic educator following, appreciate assistance -- Hemoglobin A1c: Pending -- Semglee 5 units [place] qHS -- moderate SSI for coverage -- SBGs qAC/HS  Essential hypertension -- Lisinopril 20 mg p.o. daily -- Hydralazine 25 mg p.o. every 6 hours as needed SBP >165  OSA -- Continue nocturnal CPAP   DVT prophylaxis: Holding chemical DVT prophylaxis for likely need of surgical intervention    Code Status: Full Code Family Communication: Updated patient's daughter present at bedside this morning  Disposition Plan:  Level of care: Med-Surg Status is: Inpatient Remains inpatient appropriate because: IV antibiotics, pending orthopedic evaluation with anticipated need of surgical intervention    Consultants:  Orthopedics: Dr. Ave Filter, Dr. Lajoyce Corners  Procedures:  None  Antimicrobials:  Vancomycin 6/4>> Ceftriaxone 6/4>> Metronidazole  6/4>>   Subjective: Patient seen examined bedside, resting comfortably.  Sitting in bedside chair.  Daughter present.  Reports swelling and redness improved to his right great toe.  Updated patient and daughter regarding MRI results concerning for  abscess/osteomyelitis.  Discussed with orthopedics, Dr. Ave Filter requesting transfer to [facility] for evaluation by Dr. Lajoyce Corners.  No other specific questions or concerns at this time.  Denies headache, no dizziness, no fever/chills/night sweats, no nausea/vomiting/diarrhea, no chest pain, no palpitations, no shortness of breath, no abdominal pain, no focal weakness, no fatigue, no paresthesias.  No acute events overnight per nursing staff.  Objective: Vitals:   12/25/22 2323 12/26/22 0055 12/26/22 0417 12/26/22 1002  BP: (!) 195/103 (!) 149/82 (!) 161/83 (!) 168/95  Pulse: 94  84 88  Resp: 17  15 16   Temp: 99 F (37.2 C)  98.8 F (37.1 C) 98.4 F (36.9 C)  TempSrc: Oral  Oral Oral  SpO2: 98%  98% 100%  Weight:      Height:        Intake/Output Summary (Last 24 hours) at 12/26/2022 1140 Last data filed at 12/26/2022 1002 Gross per 24 hour  Intake 1464.33 ml  Output 400 ml  Net 1064.33 ml   Filed Weights   12/25/22 1628  Weight: 101.2 kg    Examination:  Physical Exam: GEN: NAD, alert and oriented x 3, wd/wn HEENT: NCAT, PERRL, EOMI, sclera clear, MMM PULM: CTAB w/o wheezes/crackles, normal respiratory effort CV: RRR w/o M/G/R GI: abd soft, NTND, NABS, no R/G/M MSK: no peripheral edema, muscle strength globally intact 5/5 bilateral upper/lower extremities NEURO: CN II-XII intact, no focal deficits, sensation to light touch intact PSYCH: normal mood/affect Integumentary: Right great toe with erythema, edema and mild TTP, wound noted to lateral side as depicted below, otherwise no other concerning rashes/lesions/wounds noted on exposed skin surfaces.         Data Reviewed: I have personally reviewed following labs and imaging studies  CBC: Recent Labs  Lab 12/23/22 1236 12/25/22 1929 12/26/22 0019  WBC 12.3* 12.6* 11.5*  NEUTROABS 9.2* 10.3*  --   HGB 15.9 15.5 14.3  HCT 46.0 44.5 40.7  MCV 83.6 82.1 82.9  PLT 214 254 240   Basic Metabolic Panel: Recent Labs   Lab 12/23/22 1236 12/25/22 1929 12/26/22 0019  NA 134* 133* 132*  K 3.5 3.9 3.9  CL 101 98 97*  CO2 25 25 24   GLUCOSE 391* 341* 310*  BUN 15 27* 21  CREATININE 1.06 1.14 1.08  CALCIUM 9.0 9.4 8.5*   GFR: Estimated Creatinine Clearance: 84.9 mL/min (by C-G formula based on SCr of 1.08 mg/dL). Liver Function Tests: Recent Labs  Lab 12/23/22 1236 12/25/22 1929  AST 20 18  ALT 29 27  ALKPHOS 79 89  BILITOT 2.9* 1.4*  PROT 7.9 8.3*  ALBUMIN 4.1 4.0   No results for input(s): "LIPASE", "AMYLASE" in the last 168 hours. No results for input(s): "AMMONIA" in the last 168 hours. Coagulation Profile: No results for input(s): "INR", "PROTIME" in the last 168 hours. Cardiac Enzymes: No results for input(s): "CKTOTAL", "CKMB", "CKMBINDEX", "TROPONINI" in the last 168 hours. BNP (last 3 results) No results for input(s): "PROBNP" in the last 8760 hours. HbA1C: No results for input(s): "HGBA1C" in the last 72 hours. CBG: Recent Labs  Lab 12/23/22 1223 12/25/22 2329 12/26/22 0734 12/26/22 1112  GLUCAP 441* 325* 251* 218*   Lipid Profile: Recent Labs    12/26/22 0019  CHOL 119  HDL  37*  LDLCALC 72  TRIG 52  CHOLHDL 3.2   Thyroid Function Tests: No results for input(s): "TSH", "T4TOTAL", "FREET4", "T3FREE", "THYROIDAB" in the last 72 hours. Anemia Panel: No results for input(s): "VITAMINB12", "FOLATE", "FERRITIN", "TIBC", "IRON", "RETICCTPCT" in the last 72 hours. Sepsis Labs: Recent Labs  Lab 12/23/22 1503 12/23/22 1648 12/25/22 2128 12/26/22 0019  LATICACIDVEN 1.2 0.9 0.5 1.0    Recent Results (from the past 240 hour(s))  Blood culture (routine x 2)     Status: None (Preliminary result)   Collection Time: 12/23/22  3:01 PM   Specimen: BLOOD  Result Value Ref Range Status   Specimen Description   Final    BLOOD RIGHT ANTECUBITAL Performed at [facility], [address], [place], [place] [address]    Special Requests   Final    BOTTLES  DRAWN AEROBIC AND ANAEROBIC Blood Culture adequate volume Performed at [facility], [address] [place], [place] [address]    Culture   Final    NO GROWTH 3 DAYS Performed at [facility], [address] [place], [place] [address]    Report Status PENDING  Incomplete  Blood culture (routine x 2)     Status: None (Preliminary result)   Collection Time: 12/23/22  3:21 PM   Specimen: BLOOD LEFT FOREARM  Result Value Ref Range Status   Specimen Description   Final    BLOOD LEFT FOREARM Performed at [facility], [address], [place], [place] [address]    Special Requests   Final    BOTTLES DRAWN AEROBIC AND ANAEROBIC Blood Culture results may not be optimal due to an inadequate volume of blood received in culture bottles Performed at [facility], [address], [place], [place] [address]    Culture   Final    NO GROWTH 3 DAYS Performed at [facility], [address] [place], [place] [address]    Report Status PENDING  Incomplete  Culture, blood (Routine X 2) w Reflex to ID Panel     Status: None (Preliminary result)   Collection Time: 12/25/22  7:29 PM   Specimen: BLOOD  Result Value Ref Range Status   Specimen Description   Final    BLOOD BLOOD LEFT ARM Performed at [facility], [address], [place], [place] [address]    Special Requests   Final    BOTTLES DRAWN AEROBIC AND ANAEROBIC Blood Culture results may not be optimal due to an excessive volume of blood received in culture bottles Performed at [facility], [address], [place], [place] [address]    Culture   Final    NO GROWTH < 12 HOURS Performed at [facility], [address], [place], [place] [address]    Report Status PENDING  Incomplete  Culture, blood (Routine X 2) w Reflex to ID Panel     Status: None (Preliminary result)   Collection Time: 12/25/22  9:22 PM   Specimen: BLOOD  Result Value  Ref Range Status   Specimen Description   Final    BLOOD BLOOD LEFT FOREARM Performed at [facility], [address] [place], [place] [address]    Special Requests   Final    BOTTLES DRAWN AEROBIC AND ANAEROBIC Blood Culture adequate volume Performed at [facility], [address] [place], [place] [address]    Culture   Final  NO GROWTH < 12 HOURS Performed at [facility], [address], [place], [place] [address]    Report Status PENDING  Incomplete         Radiology Studies: MR FOOT RIGHT W WO CONTRAST  Result Date: 12/26/2022 CLINICAL DATA:  Osteonecrosis suspected EXAM: MRI OF THE RIGHT FOREFOOT WITHOUT AND WITH CONTRAST TECHNIQUE: Multiplanar, multisequence MR imaging of the right forefoot was performed before and after the administration of intravenous contrast. CONTRAST:  10mL GADAVIST GADOBUTROL 1 MMOL/ML IV SOLN COMPARISON:  Radiographs dated December 25, 2022 FINDINGS: Bones/Joint/Cartilage Mild edema of the distal phalanx of the first digit with enhancement on post contrast sequences. Arthritic changes of the first and second metatarsophalangeal joints. Marrow signal within remaining osseous structures is within normal limits. Hallux valgus. Ligaments Lisfranc ligament is intact. Muscles and Tendons Increased intramuscular signal of the plantar muscles suggesting myopathy/myositis. Soft tissues Fluid collection about the plantar aspect of the distal phalanx of the first digit with mild surrounding enhancement suggesting abscess with surrounding edema and inflammatory changes. Generalized soft tissue edema about the dorsum of the foot suggesting cellulitis. IMPRESSION: 1. Fluid collection about the plantar aspect of the distal phalanx of the first digit with surrounding edema and enhancement suggesting abscess. 2. Mild edema of the distal phalanx of the first digit with enhancement on post contrast sequences suggesting early  osteomyelitis. 3. Generalized soft tissue edema about the dorsum of the foot suggesting cellulitis. 4. Increased intramuscular signal of the plantar muscles suggesting myopathy/myositis. 5. Hallux valgus with arthritic changes of the first and second metatarsophalangeal joints. Electronically Signed   By: Larose Hires D.O.   On: 12/26/2022 10:14   DG Foot Complete Right  Result Date: 12/25/2022 CLINICAL DATA:  Foot pain. Recently had metal in the foot removed. Worsening swelling of the toe. EXAM: RIGHT FOOT COMPLETE - 3+ VIEW COMPARISON:  12/23/2022 FINDINGS: Degenerative changes in the first metatarsal-phalangeal joint. 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 No evidence of acute fracture or dislocation. No focal bone lesion or bone destruction. Mild soft tissue swelling suggested over the plantar aspect of the right first toe. No radiopaque foreign bodies or soft tissue gas identified. IMPRESSION: Soft tissue swelling over the right first toe. No radiopaque foreign bodies. No acute bony abnormalities. Electronically Signed   By: Burman Nieves M.D.   On: 12/25/2022 19:02        Scheduled Meds:  enoxaparin (LOVENOX) injection  50 mg Subcutaneous Q24H   insulin aspart  0-15 Units Subcutaneous TID WC   insulin aspart  0-5 Units Subcutaneous QHS   insulin glargine-yfgn  5 Units Subcutaneous QHS   lisinopril  20 mg Oral Daily   Continuous Infusions:  sodium chloride 100 mL/hr at 12/26/22 0600   cefTRIAXone (ROCEPHIN)  IV     metronidazole 500 mg (12/26/22 0845)   vancomycin       LOS: 1 day    Time spent: 56 minutes spent on chart review, discussion with nursing staff, consultants, updating family and interview/physical exam; more than 50% of that time was spent in counseling and/or coordination of care.    Alvira Philips [place], DO [facility] Available via Epic secure chat 7am-7pm After these hours, please refer to coverage provider listed on [email or web address] 12/26/2022, 11:40  AM
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
## 2022-12-26 NOTE — Plan of Care (Signed)
Discuss and review plan of care with patient/family  

## 2022-12-26 NOTE — Plan of Care (Signed)
  Problem: Education: Goal: Ability to describe self-care measures that may prevent or decrease complications (Diabetes Survival Skills Education) will improve Outcome: Progressing   Problem: Education: Goal: Knowledge of General Education information will improve Description: Including pain rating scale, medication(s)/side effects and non-pharmacologic comfort measures Outcome: Progressing   Problem: Activity: Goal: Risk for activity intolerance will decrease Outcome: Progressing   

## 2022-12-26 NOTE — Consult Note (Signed)
I was consulted by EDP for surgical management of diabetic foot wound.  Based on H&P by hospitalist this consult was premature and hospitalist does not feel based on current findings that any surgical intervention is necessary.  MRI is pending. Please reconsult orthopaedics if MRI shows there is any need for surgical consult.

## 2022-12-26 NOTE — Inpatient Diabetes Management (Signed)
Inpatient Diabetes Program Recommendations  AACE/ADA: New Consensus Statement on Inpatient Glycemic Control (2015)  Target Ranges:  Prepandial:   less than 140 mg/dL      Peak postprandial:   less than 180 mg/dL (1-2 hours)      Critically ill patients:  140 - 180 mg/dL   Lab Results  Component Value Date   GLUCAP 218 (H) 12/26/2022   HGBA1C 5.9% 09/21/2016    Review of Glycemic Control  Diabetes history: DM2 Outpatient Diabetes medications: None Current orders for Inpatient glycemic control: Semglee 5 QHS, Novolog 0-15 TID with meals and 0-5 HS  HgbA1C - 5.9% - good glycemic control  Inpatient Diabetes Program Recommendations:    Consider increasing Semglee to 10 units QHS  Follow glucose trends.  Thank you. Ailene Ards, RD, LDN, CDCES Inpatient Diabetes Coordinator [phone]

## 2022-12-26 NOTE — Progress Notes (Signed)
Patient to transfer to [facility], report called to [phone] RN and carelink scheduled.

## 2022-12-27 DIAGNOSIS — E11628 Type 2 diabetes mellitus with other skin complications: Secondary | ICD-10-CM | POA: Diagnosis not present

## 2022-12-27 DIAGNOSIS — L089 Local infection of the skin and subcutaneous tissue, unspecified: Secondary | ICD-10-CM | POA: Diagnosis not present

## 2022-12-27 LAB — CBC
HCT: 42.9 % (ref 39.0–52.0)
Hemoglobin: 15 g/dL (ref 13.0–17.0)
MCH: 29.2 pg (ref 26.0–34.0)
MCHC: 35 g/dL (ref 30.0–36.0)
MCV: 83.6 fL (ref 80.0–100.0)
Platelets: 286 10*3/uL (ref 150–400)
RBC: 5.13 MIL/uL (ref 4.22–5.81)
RDW: 13.2 % (ref 11.5–15.5)
WBC: 12.2 10*3/uL — ABNORMAL HIGH (ref 4.0–10.5)
nRBC: 0 % (ref 0.0–0.2)

## 2022-12-27 LAB — COMPREHENSIVE METABOLIC PANEL
ALT: 30 U/L (ref 0–44)
AST: 25 U/L (ref 15–41)
Albumin: 3.3 g/dL — ABNORMAL LOW (ref 3.5–5.0)
Alkaline Phosphatase: 91 U/L (ref 38–126)
Anion gap: 11 (ref 5–15)
BUN: 17 mg/dL (ref 8–23)
CO2: 23 mmol/L (ref 22–32)
Calcium: 8.4 mg/dL — ABNORMAL LOW (ref 8.9–10.3)
Chloride: 101 mmol/L (ref 98–111)
Creatinine, Ser: 1.12 mg/dL (ref 0.61–1.24)
GFR, Estimated: >60 mL/min (ref >60)
Glucose, Bld: 201 mg/dL — ABNORMAL HIGH (ref 70–99)
Potassium: 3.5 mmol/L (ref 3.5–5.1)
Sodium: 135 mmol/L (ref 135–145)
Total Bilirubin: 1.4 mg/dL — ABNORMAL HIGH (ref 0.3–1.2)
Total Protein: 7.2 g/dL (ref 6.5–8.1)

## 2022-12-27 LAB — GLUCOSE, CAPILLARY
Glucose-Capillary: 207 mg/dL — ABNORMAL HIGH (ref 70–99)
Glucose-Capillary: 263 mg/dL — ABNORMAL HIGH (ref 70–99)
Glucose-Capillary: 173 mg/dL — ABNORMAL HIGH (ref 70–99)
Glucose-Capillary: 176 mg/dL — ABNORMAL HIGH (ref 70–99)
Glucose-Capillary: 172 mg/dL — ABNORMAL HIGH (ref 70–99)

## 2022-12-27 LAB — MISC LABCORP TEST (SEND OUT): Labcorp test code: 83935

## 2022-12-27 LAB — CULTURE, BLOOD (ROUTINE X 2): Culture: NO GROWTH

## 2022-12-27 LAB — HEMOGLOBIN A1C
Hgb A1c MFr Bld: 10 % — ABNORMAL HIGH (ref 4.8–5.6)
Mean Plasma Glucose: 240 mg/dL

## 2022-12-27 MED ORDER — POVIDONE-IODINE 10 % EX SWAB
2.0000 | Freq: Once | CUTANEOUS | Status: AC
  Administered 2022-12-28: 2 via TOPICAL

## 2022-12-27 MED ORDER — LISINOPRIL 20 MG PO TABS
40.0000 mg | ORAL_TABLET | Freq: Every day | ORAL | Status: DC
  Administered 2022-12-27 – 2022-12-30 (×4): 40 mg via ORAL
  Filled 2022-12-27 (×4): qty 2

## 2022-12-27 MED ORDER — INSULIN GLARGINE-YFGN 100 UNIT/ML ~~LOC~~ SOLN
15.0000 [IU] | Freq: Every day | SUBCUTANEOUS | Status: DC
  Administered 2022-12-27 – 2022-12-30 (×4): 15 [IU] via SUBCUTANEOUS
  Filled 2022-12-27 (×4): qty 0.15

## 2022-12-27 MED ORDER — CEFAZOLIN SODIUM-DEXTROSE 2-4 GM/100ML-% IV SOLN
2.0000 g | INTRAVENOUS | Status: AC
  Administered 2022-12-28: 2 g via INTRAVENOUS
  Filled 2022-12-27: qty 100

## 2022-12-27 MED ORDER — CHLORHEXIDINE GLUCONATE 4 % EX SOLN
60.0000 mL | Freq: Once | CUTANEOUS | Status: DC
  Filled 2022-12-27: qty 60

## 2022-12-27 NOTE — Progress Notes (Signed)
PROGRESS NOTE    Pedro Moyer  GNF:[id number] DOB: 09/20/1960 DOA: 12/25/2022 PCP: Pcp, No   Brief Narrative:  Pedro Moyer is a 62 y.o. male with past medical history significant for type 2 diabetes mellitus, essential hypertension, OSA who presented to [facility] ED on 6/4 with progressive redness, swelling and pain to his right great toe.  Patient reports onset roughly 2 weeks ago as he was walking long distances at [employer] and subsequently developed blisters on his right big toe.  On the last day of his trip, patient popped the blister and put some antibiotic cream overlying this.  Since patient has been soaking his foot in Epsom salt.  He also went to the ED on Sunday and given a Tdap vaccination and oral Keflex.  Despite antibiotic use his symptoms continued to progress/worsen.  Denies fever/chills.   In regards to his diabetes, currently diet controlled over the last few years.  He reports that he lost 80 pounds and was able to stop his medication.  He is currently not followed by a physician and does not regularly check his blood sugars at home.  Additionally he was recently started on hydrochlorothiazide for his uncontrolled blood pressure after he was seen in the ED as well.   In the ED, temperature 100.9 F.  HR 97, RR 18, BP 188/99, SpO2 100% on room air.  WBC 12.6, hemoglobin 15.5, platelet 254.  Sodium 133, potassium 3.9, chloride 98, CO2 25, glucose 341, BUN 27, creatinine 1.14.  AST 18, ALT 27, total bilirubin 1.4.  CRP 10.6.  Lactic acid 0.5.  Right foot x-ray with soft tissue swelling over the right first toe, no acute bony abnormalities, no radiopaque foreign bodies identified.  Orthopedics was consulted.  TRH consulted for admission for further evaluation and management of diabetic foot wound.  Assessment & Plan:   Principal Problem:   Sepsis from diabetic infection of right foot (HCC) Active Problems:   Uncontrolled type 2 diabetes mellitus with  hyperglycemia (HCC)   Essential hypertension   Obstructive sleep apnea   Sepsis (HCC)   Osteomyelitis of great toe of right foot (HCC)   Achilles tendon contracture, right  Right great toe osteomyelitis/abscess/cellulitis/Myositis MR right foot with and without contrast with fluid collection plantar aspect distal phalanx of the first digit with surrounding edema and enhancement concerning for abscess and early osteomyelitis; myopathy/myositis.  Seen by orthopedics/Dr. Lajoyce Corners, plan for amputation.  Continue current antibiotics which is Rocephin, Flagyl and vancomycin.   Uncontrolled type 2 diabetes mellitus with hyperglycemia: Blood glucose elevated 341 on admission.  Currently diet controlled at baseline.  Previously on metformin.  Hemoglobin A1c 9.7.  On 5 units of Semglee and SSI and significantly hyperglycemic.  Will increase this to 15 units and schedule in the morning instead of night.   Essential hypertension Blood pressure significantly elevated despite of being on lisinopril 20 mg p.o. daily.  Will increase this to 40 mg.  Continue as needed hydralazine.   OSA -- Continue nocturnal CPAP  DVT prophylaxis: Lovenox   Code Status: Full Code  Family Communication:  None present at bedside.  Plan of care discussed with patient in length and he/she verbalized understanding and agreed with it.  Status is: Inpatient Remains inpatient appropriate because: Patient is scheduled for amputation tomorrow   Estimated body mass index is 31.12 kg/m as calculated from the following:   Height as of this encounter: 5\' 11"  (1.803 m).   Weight as of  this encounter: 101.2 kg.    Nutritional Assessment: Body mass index is 31.12 kg/m.Marland Kitchen Seen by dietician.  I agree with the assessment and plan as outlined below: Nutrition Status:        . Skin Assessment: I have examined the patient's skin and I agree with the wound assessment as performed by the wound care RN as outlined below:     Consultants:  Orthopedics  Procedures:  As above  Antimicrobials:  Anti-infectives (From admission, onward)    Start     Dose/Rate Route Frequency Ordered Stop   12/26/22 2200  vancomycin (VANCOREADY) IVPB 1500 mg/300 mL        1,500 mg 150 mL/hr over 120 Minutes Intravenous Every 24 hours 12/25/22 2108     12/26/22 1800  cefTRIAXone (ROCEPHIN) 2 g in sodium chloride 0.9 % 100 mL IVPB        2 g 200 mL/hr over 30 Minutes Intravenous Every 24 hours 12/25/22 2059     12/25/22 2200  metroNIDAZOLE (FLAGYL) IVPB 500 mg        500 mg 100 mL/hr over 60 Minutes Intravenous Every 12 hours 12/25/22 2059     12/25/22 1845  vancomycin (VANCOREADY) IVPB 1750 mg/350 mL        1,750 mg 175 mL/hr over 120 Minutes Intravenous  Once 12/25/22 1841 12/25/22 2229   12/25/22 1830  cefTRIAXone (ROCEPHIN) 2 g in sodium chloride 0.9 % 100 mL IVPB        2 g 200 mL/hr over 30 Minutes Intravenous  Once 12/25/22 1820 12/25/22 1955         Subjective: Seen and examined.  Complains of pain in the right toe but that is improving.  No other complaint.  Objective: Vitals:   12/26/22 1456 12/26/22 2015 12/27/22 0630 12/27/22 0746  BP: (!) 178/97 (!) 174/86 (!) 157/86 (!) 172/90  Pulse: 96 84 87 95  Resp: 17   18  Temp: 99.2 F (37.3 C)  98.5 F (36.9 C) 98.6 F (37 C)  TempSrc: Oral  Oral Oral  SpO2:  98% 97% 100%  Weight:      Height:        Intake/Output Summary (Last 24 hours) at 12/27/2022 0750 Last data filed at 12/27/2022 0600 Gross per 24 hour  Intake 2029.44 ml  Output 1000 ml  Net 1029.44 ml   Filed Weights   12/25/22 1628  Weight: 101.2 kg    Examination:  General exam: Appears calm and comfortable  Respiratory system: Clear to auscultation. Respiratory effort normal. Cardiovascular system: S1 & S2 heard, RRR. No JVD, murmurs, rubs, gallops or clicks. No pedal edema. Gastrointestinal system: Abdomen is nondistended, soft and nontender. No organomegaly or masses felt. Normal  bowel sounds heard. Central nervous system: Alert and oriented. No focal neurological deficits. Extremities: Swelling/edema/erythema and tenderness at the right great toe.  Pictured below Psychiatry: Judgement and insight appear normal. Mood & affect appropriate.    Data Reviewed: I have personally reviewed following labs and imaging studies  CBC: Recent Labs  Lab 12/23/22 1236 12/25/22 1929 12/26/22 0019  WBC 12.3* 12.6* 11.5*  NEUTROABS 9.2* 10.3*  --   HGB 15.9 15.5 14.3  HCT 46.0 44.5 40.7  MCV 83.6 82.1 82.9  PLT 214 254 240   Basic Metabolic Panel: Recent Labs  Lab 12/23/22 1236 12/25/22 1929 12/26/22 0019  NA 134* 133* 132*  K 3.5 3.9 3.9  CL 101 98 97*  CO2 25 25 24   GLUCOSE 391*  341* 310*  BUN 15 27* 21  CREATININE 1.06 1.14 1.08  CALCIUM 9.0 9.4 8.5*   GFR: Estimated Creatinine Clearance: 84.9 mL/min (by C-G formula based on SCr of 1.08 mg/dL). Liver Function Tests: Recent Labs  Lab 12/23/22 1236 12/25/22 1929  AST 20 18  ALT 29 27  ALKPHOS 79 89  BILITOT 2.9* 1.4*  PROT 7.9 8.3*  ALBUMIN 4.1 4.0   No results for input(s): "LIPASE", "AMYLASE" in the last 168 hours. No results for input(s): "AMMONIA" in the last 168 hours. Coagulation Profile: No results for input(s): "INR", "PROTIME" in the last 168 hours. Cardiac Enzymes: No results for input(s): "CKTOTAL", "CKMB", "CKMBINDEX", "TROPONINI" in the last 168 hours. BNP (last 3 results) No results for input(s): "PROBNP" in the last 8760 hours. HbA1C: Recent Labs    12/26/22 1654  HGBA1C 9.7*   CBG: Recent Labs  Lab 12/26/22 1112 12/26/22 1504 12/26/22 1840 12/26/22 2034 12/27/22 0748  GLUCAP 218* 192* 222* 237* 207*   Lipid Profile: Recent Labs    12/26/22 0019  CHOL 119  HDL 37*  LDLCALC 72  TRIG 52  CHOLHDL 3.2   Thyroid Function Tests: No results for input(s): "TSH", "T4TOTAL", "FREET4", "T3FREE", "THYROIDAB" in the last 72 hours. Anemia Panel: No results for input(s):  "VITAMINB12", "FOLATE", "FERRITIN", "TIBC", "IRON", "RETICCTPCT" in the last 72 hours. Sepsis Labs: Recent Labs  Lab 12/23/22 1503 12/23/22 1648 12/25/22 2128 12/26/22 0019  LATICACIDVEN 1.2 0.9 0.5 1.0    Recent Results (from the past 240 hour(s))  Blood culture (routine x 2)     Status: None (Preliminary result)   Collection Time: 12/23/22  3:01 PM   Specimen: BLOOD  Result Value Ref Range Status   Specimen Description   Final    BLOOD RIGHT ANTECUBITAL Performed at [facility], [address], [place], [place] [address]    Special Requests   Final    BOTTLES DRAWN AEROBIC AND ANAEROBIC Blood Culture adequate volume Performed at [facility], [address] [place], [place] [address]    Culture   Final    NO GROWTH 3 DAYS Performed at [facility], [address], [place], [place] [address]    Report Status PENDING  Incomplete  Blood culture (routine x 2)     Status: None (Preliminary result)   Collection Time: 12/23/22  3:21 PM   Specimen: BLOOD LEFT FOREARM  Result Value Ref Range Status   Specimen Description   Final    BLOOD LEFT FOREARM Performed at [facility], [address], [place], [place] [address]    Special Requests   Final    BOTTLES DRAWN AEROBIC AND ANAEROBIC Blood Culture results may not be optimal due to an inadequate volume of blood received in culture bottles Performed at [facility], [address] [place], [place] [address]    Culture   Final    NO GROWTH 3 DAYS Performed at [facility], [address] [place], [place] [address]    Report Status PENDING  Incomplete  Culture, blood (Routine X 2) w Reflex to ID Panel     Status: None (Preliminary result)   Collection Time: 12/25/22  7:29 PM   Specimen: BLOOD  Result Value Ref Range Status   Specimen Description   Final    BLOOD BLOOD LEFT ARM Performed at [facility], [address]  [place], [place] [address]    Special Requests  Final    BOTTLES DRAWN AEROBIC AND ANAEROBIC Blood Culture results may not be optimal due to an excessive volume of blood received in culture bottles Performed at [facility], [address] [place], [place] [address]    Culture   Final    NO GROWTH < 12 HOURS Performed at [facility], [address] [place], [place] [address]    Report Status PENDING  Incomplete  Culture, blood (Routine X 2) w Reflex to ID Panel     Status: None (Preliminary result)   Collection Time: 12/25/22  9:22 PM   Specimen: BLOOD  Result Value Ref Range Status   Specimen Description   Final    BLOOD BLOOD LEFT FOREARM Performed at [facility], [address] [place], [place] [address]    Special Requests   Final    BOTTLES DRAWN AEROBIC AND ANAEROBIC Blood Culture adequate volume Performed at [facility], [address] [place], [place] [address]    Culture   Final    NO GROWTH < 12 HOURS Performed at [facility], [address], [place], [place] [address]    Report Status PENDING  Incomplete     Radiology Studies: MR FOOT RIGHT W WO CONTRAST  Result Date: 12/26/2022 CLINICAL DATA:  Osteonecrosis suspected EXAM: MRI OF THE RIGHT FOREFOOT WITHOUT AND WITH CONTRAST TECHNIQUE: Multiplanar, multisequence MR imaging of the right forefoot was performed before and after the administration of intravenous contrast. CONTRAST:  10mL GADAVIST GADOBUTROL 1 MMOL/ML IV SOLN COMPARISON:  Radiographs dated December 25, 2022 FINDINGS: Bones/Joint/Cartilage Mild edema of the distal phalanx of the first digit with enhancement on post contrast sequences. Arthritic changes of the first and second metatarsophalangeal joints. Marrow signal within remaining osseous structures is within normal limits. Hallux valgus. Ligaments Lisfranc ligament is intact. Muscles and Tendons Increased intramuscular signal of the plantar  muscles suggesting myopathy/myositis. Soft tissues Fluid collection about the plantar aspect of the distal phalanx of the first digit with mild surrounding enhancement suggesting abscess with surrounding edema and inflammatory changes. Generalized soft tissue edema about the dorsum of the foot suggesting cellulitis. IMPRESSION: 1. Fluid collection about the plantar aspect of the distal phalanx of the first digit with surrounding edema and enhancement suggesting abscess. 2. Mild edema of the distal phalanx of the first digit with enhancement on post contrast sequences suggesting early osteomyelitis. 3. Generalized soft tissue edema about the dorsum of the foot suggesting cellulitis. 4. Increased intramuscular signal of the plantar muscles suggesting myopathy/myositis. 5. Hallux valgus with arthritic changes of the first and second metatarsophalangeal joints. Electronically Signed   By: Larose Hires D.O.   On: 12/26/2022 10:14   DG Foot Complete Right  Result Date: 12/25/2022 CLINICAL DATA:  Foot pain. Recently had metal in the foot removed. Worsening swelling of the toe. EXAM: RIGHT FOOT COMPLETE - 3+ VIEW COMPARISON:  12/23/2022 FINDINGS: Degenerative changes in the first metatarsal-phalangeal joint. 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 No evidence of acute fracture or dislocation. No focal bone lesion or bone destruction. Mild soft tissue swelling suggested over the plantar aspect of the right first toe. No radiopaque foreign bodies or soft tissue gas identified. IMPRESSION: Soft tissue swelling over the right first toe. No radiopaque foreign bodies. No acute bony abnormalities. Electronically Signed   By: Burman Nieves M.D.   On: 12/25/2022 19:02    Scheduled Meds:  enoxaparin (LOVENOX) injection  50 mg Subcutaneous Q24H  insulin aspart  0-15 Units Subcutaneous TID WC   insulin aspart  0-5 Units Subcutaneous QHS   insulin glargine-yfgn  5 Units Subcutaneous QHS   lisinopril  20 mg Oral  Daily   Continuous Infusions:  sodium chloride 100 mL/hr at 12/27/22 0414   cefTRIAXone (ROCEPHIN)  IV 2 g (12/26/22 1758)   metronidazole 500 mg (12/26/22 2144)   vancomycin 1,500 mg (12/26/22 2346)     LOS: 2 days   Hughie Closs, MD [facility]  12/27/2022, 7:50 AM   *Please note that this is a verbal dictation therefore any spelling or grammatical errors are due to the "Dragon Medical One" system interpretation.  Please page via Amion and do not message via secure chat for urgent patient care matters. Secure chat can be used for non urgent patient care matters.  How to contact the [facility] Attending or Consulting provider 7A - 7P or covering provider during after hours 7P -7A, for this patient?  Check the care team in [facility] and look for a) attending/consulting TRH provider listed and b) the [facility] team listed. Page or secure chat 7A-7P. Log into [email or web address] and use [place]'s universal password to access. If you do not have the password, please contact the hospital operator. Locate the [facility] provider you are looking for under [facility] and page to a number that you can be directly reached. If you still have difficulty reaching the provider, please page the [facility] (Director on Call) for the Hospitalists listed on amion for assistance.

## 2022-12-27 NOTE — Plan of Care (Signed)

## 2022-12-27 NOTE — Anesthesia Preprocedure Evaluation (Addendum)
Anesthesia Evaluation  Patient identified by MRN, date of birth, ID band Patient awake    Reviewed: Allergy & Precautions, NPO status , Patient's Chart, lab work & pertinent test results  History of Anesthesia Complications (+) PONV and history of anesthetic complications  Airway Mallampati: III  TM Distance: >3 FB Neck ROM: Full    Dental  (+) Dental Advisory Given,  Two temporary crowns on the bottom:   Pulmonary neg shortness of breath, sleep apnea and Continuous Positive Airway Pressure Ventilation , neg COPD, neg recent URI   Pulmonary exam normal breath sounds clear to auscultation       Cardiovascular hypertension, Pt. on medications (-) angina (-) Past MI, (-) Cardiac Stents and (-) CABG + dysrhythmias (PVCs, RBBB)  Rhythm:Regular Rate:Normal     Neuro/Psych negative neurological ROS     GI/Hepatic negative GI ROS, Neg liver ROS,,,  Endo/Other  diabetes (Hgb A1c 9.7), Poorly Controlled, Type 2, Oral Hypoglycemic Agents    Renal/GU negative Renal ROS     Musculoskeletal   Abdominal  (+) + obese  Peds  Hematology negative hematology ROS (+)   Anesthesia Other Findings Sepsis from osteomyelitis of right great toe  Reproductive/Obstetrics                             Anesthesia Physical Anesthesia Plan  ASA: 3  Anesthesia Plan: MAC and Regional   Post-op Pain Management: Regional block*   Induction: Intravenous  PONV Risk Score and Plan: 1 and Ondansetron and Treatment may vary due to age or medical condition  Airway Management Planned: Natural Airway and Simple Face Mask  Additional Equipment:   Intra-op Plan:   Post-operative Plan: Extubation in OR  Informed Consent: I have reviewed the patients History and Physical, chart, labs and discussed the procedure including the risks, benefits and alternatives for the proposed anesthesia with the patient or authorized  representative who has indicated his/her understanding and acceptance.     Dental advisory given  Plan Discussed with: CRNA and Anesthesiologist  Anesthesia Plan Comments: (Discussed potential risks of nerve blocks including, but not limited to, infection, bleeding, nerve damage, seizures, pneumothorax, respiratory depression, and potential failure of the block. Alternatives to nerve blocks discussed. All questions answered.  Discussed with patient risks of MAC including, but not limited to, minor pain or discomfort, hearing people in the room, and possible need for backup general anesthesia. Risks for general anesthesia also discussed including, but not limited to, sore throat, hoarse voice, chipped/damaged teeth, injury to vocal cords, nausea and vomiting, allergic reactions, lung infection, heart attack, stroke, and death. All questions answered. )       Anesthesia Quick Evaluation

## 2022-12-27 NOTE — TOC Initial Note (Signed)
Transition of Care [facility]) - Initial/Assessment Note    Patient Details  Name: Pedro Moyer MRN: [id number] Date of Birth: October 15, 1960  [facility] CM/SW Contact:    Janae Bridgeman, RN Phone Number: 12/27/2022, 11:22 AM  Clinical Narrative:                 Cm met with the patient at the bedside to discuss TOC needs.  The patient was admitted for Right toe infection and plans to have surgery tomorrow with Dr. Lajoyce Corners.  Patient lives alone and plans to stay with his daughter, Waymon Budge for a few weeks post-operatively.  Patient has glucometer at home and hopes to find a knee scooter at his home that he has used previously.  Patient states that he needs a PCP - I called [facility] and they are unable to accept him due to his insurance provider.  I called [facility] and patient was schedule for a new patient visit on February 20, 2023 at 0830 - reminder placed in the AVS.  Expected Discharge Plan: Home/Self Care Barriers to Discharge: Continued Medical Work up   Patient Goals and CMS Choice Patient states their goals for this hospitalization and ongoing recovery are:: To return home CMS Medicare.gov Compare Post Acute Care list provided to:: Patient Choice offered to / list presented to : Patient [place] ownership interest in [facility].provided to:: Patient    Expected Discharge Plan and Services   Discharge Planning Services: CM Consult   Living arrangements for the past 2 months: Single Family Home                 DME Arranged: N/A DME Agency: NA                  Prior Living Arrangements/Services Living arrangements for the past 2 months: Single Family Home Lives with:: Self Patient language and need for interpreter reviewed:: Yes Do you feel safe going back to the place where you live?: Yes      Need for Family Participation in Patient Care: Yes (Comment) Care giver support system in place?: Yes  (comment) Current home services: DME (Glucometer) Criminal Activity/Legal Involvement Pertinent to Current Situation/Hospitalization: No - Comment as needed  Activities of Daily Living Home Assistive Devices/Equipment: None ADL Screening (condition at time of admission) Patient's cognitive ability adequate to safely complete daily activities?: Yes Is the patient deaf or have difficulty hearing?: No Does the patient have difficulty seeing, even when wearing glasses/contacts?: No Does the patient have difficulty concentrating, remembering, or making decisions?: No Patient able to express need for assistance with ADLs?: Yes Does the patient have difficulty dressing or bathing?: No Independently performs ADLs?: Yes (appropriate for developmental age) Does the patient have difficulty walking or climbing stairs?: No Weakness of Legs: None Weakness of Arms/Hands: None  Permission Sought/Granted Permission sought to share information with : Case Manager Permission granted to share information with : Yes, Verbal Permission Granted     Permission granted to share info w AGENCY: PCP set up with [facility] in [place] - appt scheduled for July 31 at 0830        Emotional Assessment Appearance:: Appears stated age Attitude/Demeanor/Rapport: Gracious Affect (typically observed): Accepting Orientation: : Oriented to Self, Oriented to Place, Oriented to  Time, Oriented to Situation Alcohol / Substance Use: Not Applicable Psych Involvement: No (comment)  Admission diagnosis:  Diabetic foot infection (HCC) [W09.811, L08.9]  Diabetic infection of right foot (HCC) [Z61.096, L08.9] Patient Active Problem List   Diagnosis Date Noted   Osteomyelitis of great toe of right foot (HCC) 12/26/2022   Achilles tendon contracture, right 12/26/2022   Sepsis from diabetic infection of right foot (HCC) 12/25/2022   Sepsis (HCC) 12/25/2022   Essential hypertension 12/25/2022    Uncontrolled type 2 diabetes mellitus with hyperglycemia (HCC) 05/09/2016   Dyspnea 04/18/2016   Obstructive sleep apnea 03/19/2014   PCP:  Pcp, No Pharmacy:   [facility] [address] [place] - [address] [place] [address] Phone: [phone] Fax: [phone]  [place] - [facility] [address] [place] [address] Phone: [phone] Fax: [phone]  [id number] [place], [place] - [address] AT [facility] [address] [place] [address] Phone: [phone] Fax: [phone]     Social Determinants of Health (SDOH) Social History: SDOH Screenings   Food Insecurity: No Food Insecurity (12/26/2022)  Housing: Patient Declined (12/26/2022)  Transportation Needs: No Transportation Needs (12/26/2022)  Utilities: Not At Risk (12/26/2022)  Tobacco Use: Low Risk  (12/26/2022)   SDOH Interventions:     Readmission Risk Interventions    12/26/2022   11:03 AM  Readmission Risk Prevention Plan  Post Dischage Appt Complete  Medication Screening Complete  Transportation Screening Complete

## 2022-12-28 ENCOUNTER — Encounter (HOSPITAL_COMMUNITY)
Admission: EM | Disposition: A | Discharge status: Home / Self Care | Payer: Self-pay | Source: Home / Self Care | Attending: Family Medicine

## 2022-12-28 ENCOUNTER — Inpatient Hospital Stay (HOSPITAL_COMMUNITY): Payer: BLUE CROSS/BLUE SHIELD | Admitting: Anesthesiology

## 2022-12-28 ENCOUNTER — Encounter (HOSPITAL_COMMUNITY): Payer: Self-pay | Admitting: Family Medicine

## 2022-12-28 ENCOUNTER — Telehealth (HOSPITAL_COMMUNITY): Payer: Self-pay | Admitting: Pharmacy Technician

## 2022-12-28 ENCOUNTER — Other Ambulatory Visit (HOSPITAL_COMMUNITY): Payer: Self-pay

## 2022-12-28 DIAGNOSIS — E11628 Type 2 diabetes mellitus with other skin complications: Secondary | ICD-10-CM | POA: Diagnosis not present

## 2022-12-28 DIAGNOSIS — L089 Local infection of the skin and subcutaneous tissue, unspecified: Secondary | ICD-10-CM | POA: Diagnosis not present

## 2022-12-28 HISTORY — PX: AMPUTATION: SHX166

## 2022-12-28 LAB — GLUCOSE, CAPILLARY
Glucose-Capillary: 165 mg/dL — ABNORMAL HIGH (ref 70–99)
Glucose-Capillary: 153 mg/dL — ABNORMAL HIGH (ref 70–99)
Glucose-Capillary: 169 mg/dL — ABNORMAL HIGH (ref 70–99)
Glucose-Capillary: 154 mg/dL — ABNORMAL HIGH (ref 70–99)
Glucose-Capillary: 229 mg/dL — ABNORMAL HIGH (ref 70–99)
Glucose-Capillary: 140 mg/dL — ABNORMAL HIGH (ref 70–99)

## 2022-12-28 LAB — CULTURE, BLOOD (ROUTINE X 2)
Culture: NO GROWTH
Culture: NO GROWTH

## 2022-12-28 LAB — BASIC METABOLIC PANEL
Anion gap: 8 (ref 5–15)
BUN: 12 mg/dL (ref 8–23)
CO2: 24 mmol/L (ref 22–32)
Calcium: 8.2 mg/dL — ABNORMAL LOW (ref 8.9–10.3)
Chloride: 102 mmol/L (ref 98–111)
Creatinine, Ser: 0.99 mg/dL (ref 0.61–1.24)
GFR, Estimated: >60 mL/min (ref >60)
Glucose, Bld: 170 mg/dL — ABNORMAL HIGH (ref 70–99)
Potassium: 3.6 mmol/L (ref 3.5–5.1)
Sodium: 134 mmol/L — ABNORMAL LOW (ref 135–145)

## 2022-12-28 LAB — SURGICAL PCR SCREEN
MRSA, PCR: NEGATIVE
Staphylococcus aureus: NEGATIVE

## 2022-12-28 SURGERY — AMPUTATION, FOOT, RAY
Anesthesia: Monitor Anesthesia Care | Site: First Toe | Laterality: Right

## 2022-12-28 MED ORDER — JUVEN PO PACK
1.0000 | PACK | Freq: Two times a day (BID) | ORAL | Status: DC
  Administered 2022-12-28 – 2022-12-30 (×5): 1 via ORAL
  Filled 2022-12-28 (×4): qty 1

## 2022-12-28 MED ORDER — PANTOPRAZOLE SODIUM 40 MG PO TBEC
40.0000 mg | DELAYED_RELEASE_TABLET | Freq: Every day | ORAL | Status: DC
  Administered 2022-12-28 – 2022-12-30 (×3): 40 mg via ORAL
  Filled 2022-12-28 (×3): qty 1

## 2022-12-28 MED ORDER — POLYETHYLENE GLYCOL 3350 17 G PO PACK
17.0000 g | PACK | Freq: Every day | ORAL | Status: DC | PRN
  Administered 2022-12-29: 17 g via ORAL
  Filled 2022-12-28: qty 1

## 2022-12-28 MED ORDER — HYDROMORPHONE HCL 1 MG/ML IJ SOLN: 0.5000 mg | INTRAMUSCULAR | Status: DC | PRN

## 2022-12-28 MED ORDER — PROPOFOL 500 MG/50ML IV EMUL
INTRAVENOUS | Status: DC | PRN
  Administered 2022-12-28: 50 ug/kg/min via INTRAVENOUS
  Administered 2022-12-28: 20 mg via INTRAVENOUS

## 2022-12-28 MED ORDER — CEFAZOLIN SODIUM-DEXTROSE 2-4 GM/100ML-% IV SOLN
2.0000 g | Freq: Three times a day (TID) | INTRAVENOUS | Status: DC
  Administered 2022-12-28: 2 g via INTRAVENOUS
  Filled 2022-12-28: qty 100

## 2022-12-28 MED ORDER — LACTATED RINGERS IV SOLN: INTRAVENOUS | Status: DC

## 2022-12-28 MED ORDER — ACETAMINOPHEN 325 MG PO TABS: 325.0000 mg | ORAL_TABLET | Freq: Four times a day (QID) | ORAL | Status: DC | PRN

## 2022-12-28 MED ORDER — MAGNESIUM SULFATE 2 GM/50ML IV SOLN: 2.0000 g | Freq: Every day | INTRAVENOUS | Status: DC | PRN

## 2022-12-28 MED ORDER — PHENYLEPHRINE 80 MCG/ML (10ML) SYRINGE FOR IV PUSH (FOR BLOOD PRESSURE SUPPORT)
PREFILLED_SYRINGE | INTRAVENOUS | Status: AC
  Filled 2022-12-28: qty 10

## 2022-12-28 MED ORDER — OXYCODONE HCL 5 MG PO TABS
10.0000 mg | ORAL_TABLET | ORAL | Status: DC | PRN
  Administered 2022-12-29: 15 mg via ORAL
  Filled 2022-12-28: qty 3

## 2022-12-28 MED ORDER — POTASSIUM CHLORIDE CRYS ER 20 MEQ PO TBCR: 20.0000 meq | EXTENDED_RELEASE_TABLET | Freq: Every day | ORAL | Status: DC | PRN

## 2022-12-28 MED ORDER — ALUM & MAG HYDROXIDE-SIMETH 200-200-20 MG/5ML PO SUSP: 15.0000 mL | ORAL | Status: DC | PRN

## 2022-12-28 MED ORDER — INSULIN STARTER KIT- PEN NEEDLES (ENGLISH)
1.0000 | Freq: Once | Status: AC
  Administered 2022-12-28: 1
  Filled 2022-12-28: qty 1

## 2022-12-28 MED ORDER — DOCUSATE SODIUM 100 MG PO CAPS
100.0000 mg | ORAL_CAPSULE | Freq: Every day | ORAL | Status: DC
  Administered 2022-12-29 – 2022-12-30 (×2): 100 mg via ORAL
  Filled 2022-12-28 (×3): qty 1

## 2022-12-28 MED ORDER — LABETALOL HCL 5 MG/ML IV SOLN: 10.0000 mg | INTRAVENOUS | Status: DC | PRN

## 2022-12-28 MED ORDER — DEXMEDETOMIDINE HCL IN NACL 80 MCG/20ML IV SOLN
INTRAVENOUS | Status: DC | PRN
  Administered 2022-12-28: 4 ug via INTRAVENOUS

## 2022-12-28 MED ORDER — OXYCODONE HCL 5 MG/5ML PO SOLN: 5.0000 mg | Freq: Once | ORAL | Status: DC | PRN

## 2022-12-28 MED ORDER — EPHEDRINE 5 MG/ML INJ
INTRAVENOUS | Status: AC
  Filled 2022-12-28: qty 5

## 2022-12-28 MED ORDER — FENTANYL CITRATE (PF) 100 MCG/2ML IJ SOLN: 25.0000 ug | INTRAMUSCULAR | Status: DC | PRN

## 2022-12-28 MED ORDER — GUAIFENESIN-DM 100-10 MG/5ML PO SYRP: 15.0000 mL | ORAL_SOLUTION | ORAL | Status: DC | PRN

## 2022-12-28 MED ORDER — AMISULPRIDE (ANTIEMETIC) 5 MG/2ML IV SOLN: 10.0000 mg | Freq: Once | INTRAVENOUS | Status: DC | PRN

## 2022-12-28 MED ORDER — DEXAMETHASONE SODIUM PHOSPHATE 10 MG/ML IJ SOLN
INTRAMUSCULAR | Status: AC
  Filled 2022-12-28: qty 1

## 2022-12-28 MED ORDER — METOPROLOL TARTRATE 5 MG/5ML IV SOLN: 2.0000 mg | INTRAVENOUS | Status: DC | PRN

## 2022-12-28 MED ORDER — ROPIVACAINE HCL 5 MG/ML IJ SOLN
INTRAMUSCULAR | Status: DC | PRN
  Administered 2022-12-28: 30 mL via PERINEURAL
  Administered 2022-12-28: 15 mL via PERINEURAL

## 2022-12-28 MED ORDER — ACETAMINOPHEN 10 MG/ML IV SOLN: 1000.0000 mg | Freq: Once | INTRAVENOUS | Status: DC | PRN

## 2022-12-28 MED ORDER — LIVING WELL WITH DIABETES BOOK
Freq: Once | Status: AC
  Filled 2022-12-28: qty 1

## 2022-12-28 MED ORDER — ORAL CARE MOUTH RINSE: 15.0000 mL | Freq: Once | OROMUCOSAL | Status: AC

## 2022-12-28 MED ORDER — MIDAZOLAM HCL 2 MG/2ML IJ SOLN
INTRAMUSCULAR | Status: DC | PRN
  Administered 2022-12-28: 2 mg via INTRAVENOUS

## 2022-12-28 MED ORDER — VITAMIN C 500 MG PO TABS
1000.0000 mg | ORAL_TABLET | Freq: Every day | ORAL | Status: DC
  Administered 2022-12-28 – 2022-12-30 (×3): 1000 mg via ORAL
  Filled 2022-12-28 (×3): qty 2

## 2022-12-28 MED ORDER — FENTANYL CITRATE (PF) 100 MCG/2ML IJ SOLN
INTRAMUSCULAR | Status: AC
  Filled 2022-12-28: qty 2

## 2022-12-28 MED ORDER — SODIUM CHLORIDE 0.9 % IV SOLN: INTRAVENOUS | Status: DC

## 2022-12-28 MED ORDER — CHLORHEXIDINE GLUCONATE 0.12 % MT SOLN
15.0000 mL | Freq: Once | OROMUCOSAL | Status: AC
  Administered 2022-12-28: 15 mL via OROMUCOSAL

## 2022-12-28 MED ORDER — OXYCODONE HCL 5 MG PO TABS: 5.0000 mg | ORAL_TABLET | Freq: Once | ORAL | Status: DC | PRN

## 2022-12-28 MED ORDER — ONDANSETRON HCL 4 MG/2ML IJ SOLN
INTRAMUSCULAR | Status: AC
  Filled 2022-12-28: qty 2

## 2022-12-28 MED ORDER — PHENOL 1.4 % MT LIQD: 1.0000 | OROMUCOSAL | Status: DC | PRN

## 2022-12-28 MED ORDER — ONDANSETRON HCL 4 MG/2ML IJ SOLN
4.0000 mg | Freq: Four times a day (QID) | INTRAMUSCULAR | Status: DC | PRN
  Administered 2022-12-29: 4 mg via INTRAVENOUS
  Filled 2022-12-28: qty 2

## 2022-12-28 MED ORDER — MIDAZOLAM HCL 2 MG/2ML IJ SOLN
INTRAMUSCULAR | Status: AC
  Filled 2022-12-28: qty 2

## 2022-12-28 MED ORDER — ONDANSETRON HCL 4 MG/2ML IJ SOLN
INTRAMUSCULAR | Status: DC | PRN
  Administered 2022-12-28: 4 mg via INTRAVENOUS

## 2022-12-28 MED ORDER — BISACODYL 5 MG PO TBEC
5.0000 mg | DELAYED_RELEASE_TABLET | Freq: Every day | ORAL | Status: DC | PRN
  Filled 2022-12-28: qty 1

## 2022-12-28 MED ORDER — 0.9 % SODIUM CHLORIDE (POUR BTL) OPTIME
TOPICAL | Status: DC | PRN
  Administered 2022-12-28: 1000 mL

## 2022-12-28 MED ORDER — MAGNESIUM CITRATE PO SOLN: 1.0000 | Freq: Once | ORAL | Status: DC | PRN

## 2022-12-28 MED ORDER — HYDRALAZINE HCL 20 MG/ML IJ SOLN: 5.0000 mg | INTRAMUSCULAR | Status: DC | PRN

## 2022-12-28 MED ORDER — OXYCODONE HCL 5 MG PO TABS
5.0000 mg | ORAL_TABLET | ORAL | Status: DC | PRN
  Administered 2022-12-28 – 2022-12-29 (×2): 10 mg via ORAL
  Filled 2022-12-28 (×2): qty 2

## 2022-12-28 MED ORDER — ZINC SULFATE 220 (50 ZN) MG PO CAPS
220.0000 mg | ORAL_CAPSULE | Freq: Every day | ORAL | Status: DC
  Administered 2022-12-28 – 2022-12-30 (×3): 220 mg via ORAL
  Filled 2022-12-28 (×3): qty 1

## 2022-12-28 SURGICAL SUPPLY — 36 items
BAG COUNTER SPONGE SURGICOUNT (BAG) ×1 IMPLANT
BAG SPNG CNTER NS LX DISP (BAG) ×1
BLADE SAW SGTL MED 73X18.5 STR (BLADE) IMPLANT
BLADE SURG 21 STRL SS (BLADE) ×1 IMPLANT
BNDG COHESIVE 4X5 TAN STRL (GAUZE/BANDAGES/DRESSINGS) ×1 IMPLANT
BNDG COHESIVE 6X5 TAN NS LF (GAUZE/BANDAGES/DRESSINGS) IMPLANT
BNDG GAUZE DERMACEA FLUFF 4 (GAUZE/BANDAGES/DRESSINGS) ×1 IMPLANT
BNDG GZE DERMACEA 4 6PLY (GAUZE/BANDAGES/DRESSINGS)
COVER SURGICAL LIGHT HANDLE (MISCELLANEOUS) ×2 IMPLANT
DRAPE DERMATAC (DRAPES) IMPLANT
DRAPE U-SHAPE 47X51 STRL (DRAPES) ×2 IMPLANT
DRESSING PEEL AND PLC PRVNA 13 (GAUZE/BANDAGES/DRESSINGS) IMPLANT
DRSG ADAPTIC 3X8 NADH LF (GAUZE/BANDAGES/DRESSINGS) ×1 IMPLANT
DRSG PEEL AND PLACE PREVENA 13 (GAUZE/BANDAGES/DRESSINGS) ×1
DURAPREP 26ML APPLICATOR (WOUND CARE) ×1 IMPLANT
ELECT REM PT RETURN 9FT ADLT (ELECTROSURGICAL) ×1
ELECTRODE REM PT RTRN 9FT ADLT (ELECTROSURGICAL) ×1 IMPLANT
GAUZE PAD ABD 8X10 STRL (GAUZE/BANDAGES/DRESSINGS) ×2 IMPLANT
GAUZE SPONGE 4X4 12PLY STRL (GAUZE/BANDAGES/DRESSINGS) ×1 IMPLANT
GLOVE BIOGEL PI IND STRL 9 (GLOVE) ×1 IMPLANT
GLOVE SURG ORTHO 9.0 STRL STRW (GLOVE) ×1 IMPLANT
GOWN STRL REUS W/ TWL XL LVL3 (GOWN DISPOSABLE) ×2 IMPLANT
GOWN STRL REUS W/TWL XL LVL3 (GOWN DISPOSABLE) ×2
GRAFT SKIN WND MICRO 38 (Tissue) IMPLANT
KIT BASIN OR (CUSTOM PROCEDURE TRAY) ×1 IMPLANT
KIT DRSG PREVENA PLUS 7DAY 125 (MISCELLANEOUS) IMPLANT
KIT TURNOVER KIT B (KITS) ×1 IMPLANT
NS IRRIG 1000ML POUR BTL (IV SOLUTION) ×1 IMPLANT
PACK ORTHO EXTREMITY (CUSTOM PROCEDURE TRAY) ×1 IMPLANT
PAD ARMBOARD 7.5X6 YLW CONV (MISCELLANEOUS) ×2 IMPLANT
SPONGE T-LAP 18X18 ~~LOC~~+RFID (SPONGE) IMPLANT
STOCKINETTE IMPERVIOUS LG (DRAPES) IMPLANT
SUT ETHILON 2 0 PSLX (SUTURE) ×1 IMPLANT
TOWEL GREEN STERILE (TOWEL DISPOSABLE) ×1 IMPLANT
TUBE CONNECTING 12X1/4 (SUCTIONS) ×1 IMPLANT
YANKAUER SUCT BULB TIP NO VENT (SUCTIONS) ×1 IMPLANT

## 2022-12-28 NOTE — Progress Notes (Signed)
   12/28/22 1000  Spiritual Encounters  Type of Visit Initial  Care provided to: Pt and family  Conversation partners present during encounter Nurse  Referral source Patient request;Nurse (RN/NT/LPN)  Reason for visit Advance directives  OnCall Visit No  Spiritual Framework  Presenting Themes Goals in life/care;Impactful experiences and emotions  Community/Connection Family  Patient Stress Factors Health changes  Family Stress Factors Not reviewed  Interventions  Spiritual Care Interventions Made Established relationship of care and support;Compassionate presence;Reflective listening;Normalization of emotions;Encouragement  Intervention Outcomes  Outcomes Connection to spiritual care;Awareness of support;Reduced anxiety  Advance Directives (For Healthcare)  Does Patient Have a Medical Advance Directive? No  Would patient like information on creating a medical advance directive? Yes (Inpatient - patient requests chaplain consult to create a medical advance directive)   Chaplain received a page from patient's nurse 0830. Chaplain provided AD education and completed paperwork. AD was notarized and copies have been scanned and placed in the physical chart. Original was provided to daughter Tamela Oddi as patient was in surgery.   Arlyce Dice, Chaplain Resident [phone]

## 2022-12-28 NOTE — Progress Notes (Signed)
PROGRESS NOTE    HOLGER ARLINGHAUS  ZOX:[id number] DOB: 04/04/1961 DOA: 12/25/2022 PCP: Pcp, No   Brief Narrative:  Pedro Moyer is a 62 y.o. male with past medical history significant for type 2 diabetes mellitus, essential hypertension, OSA who presented to [facility] ED on 6/4 with progressive redness, swelling and pain to his right great toe.  Patient reports onset roughly 2 weeks ago as he was walking long distances at [employer] and subsequently developed blisters on his right big toe.  On the last day of his trip, patient popped the blister and put some antibiotic cream overlying this.  Since patient has been soaking his foot in Epsom salt.  He also went to the ED on Sunday and given a Tdap vaccination and oral Keflex.  Despite antibiotic use his symptoms continued to progress/worsen.  Denies fever/chills.   In regards to his diabetes, currently diet controlled over the last few years.  He reports that he lost 80 pounds and was able to stop his medication.  He is currently not followed by a physician and does not regularly check his blood sugars at home.  Additionally he was recently started on hydrochlorothiazide for his uncontrolled blood pressure after he was seen in the ED as well.   In the ED, temperature 100.9 F.  HR 97, RR 18, BP 188/99, SpO2 100% on room air.  WBC 12.6, hemoglobin 15.5, platelet 254.  Sodium 133, potassium 3.9, chloride 98, CO2 25, glucose 341, BUN 27, creatinine 1.14.  AST 18, ALT 27, total bilirubin 1.4.  CRP 10.6.  Lactic acid 0.5.  Right foot x-ray with soft tissue swelling over the right first toe, no acute bony abnormalities, no radiopaque foreign bodies identified.  Orthopedics was consulted.  TRH consulted for admission for further evaluation and management of diabetic foot wound.  Assessment & Plan:   Principal Problem:   Sepsis from diabetic infection of right foot (HCC) Active Problems:   Uncontrolled type 2 diabetes mellitus with  hyperglycemia (HCC)   Essential hypertension   Obstructive sleep apnea   Sepsis (HCC)   Osteomyelitis of great toe of right foot (HCC)   Achilles tendon contracture, right  Right great toe osteomyelitis/abscess/cellulitis/Myositis MR right foot with and without contrast with fluid collection plantar aspect distal phalanx of the first digit with surrounding edema and enhancement concerning for abscess and early osteomyelitis; myopathy/myositis.  Seen by orthopedics/Dr. Lajoyce Corners, plan for amputation today.  Continue current antibiotics which is Rocephin, Flagyl and vancomycin.   Uncontrolled type 2 diabetes mellitus with hyperglycemia: Blood glucose elevated 341 on admission.  Currently diet controlled at baseline.  Previously on metformin.  Hemoglobin A1c 9.7.  On 15 units of Semglee and SSI and blood sugar now better controlled.   Essential hypertension Blood pressure significantly elevated despite of being on lisinopril 40 mg p.o. daily.  Will monitor for now.   OSA -- Continue nocturnal CPAP  DVT prophylaxis: Lovenox   Code Status: Full Code  Family Communication: Wife present at bedside.  Plan of care discussed with patient in length and he/she verbalized understanding and agreed with it.  Status is: Inpatient Remains inpatient appropriate because: Patient is scheduled for amputation today   Estimated body mass index is 31.12 kg/m as calculated from the following:   Height as of this encounter: 5\' 11"  (1.803 m).   Weight as of this encounter: 101.2 kg.    Nutritional Assessment: Body mass index is 31.12 kg/m.Marland Kitchen Seen by dietician.  I agree with the assessment and plan as outlined below: Nutrition Status:        . Skin Assessment: I have examined the patient's skin and I agree with the wound assessment as performed by the wound care RN as outlined below:    Consultants:  Orthopedics  Procedures:  As above  Antimicrobials:  Anti-infectives (From admission, onward)     Start     Dose/Rate Route Frequency Ordered Stop   12/28/22 0600  ceFAZolin (ANCEF) IVPB 2g/100 mL premix        2 g 200 mL/hr over 30 Minutes Intravenous On call to O.R. 12/27/22 1834 12/29/22 0559   12/26/22 2200  [MAR Hold]  vancomycin (VANCOREADY) IVPB 1500 mg/300 mL        (MAR Hold since Fri 12/28/2022 at 0840.Hold Reason: Transfer to a Procedural area)   1,500 mg 150 mL/hr over 120 Minutes Intravenous Every 24 hours 12/25/22 2108     12/26/22 1800  [MAR Hold]  cefTRIAXone (ROCEPHIN) 2 g in sodium chloride 0.9 % 100 mL IVPB        (MAR Hold since Fri 12/28/2022 at 0840.Hold Reason: Transfer to a Procedural area)   2 g 200 mL/hr over 30 Minutes Intravenous Every 24 hours 12/25/22 2059     12/25/22 2200  [MAR Hold]  metroNIDAZOLE (FLAGYL) IVPB 500 mg        (MAR Hold since Fri 12/28/2022 at 0840.Hold Reason: Transfer to a Procedural area)   500 mg 100 mL/hr over 60 Minutes Intravenous Every 12 hours 12/25/22 2059     12/25/22 1845  vancomycin (VANCOREADY) IVPB 1750 mg/350 mL        1,750 mg 175 mL/hr over 120 Minutes Intravenous  Once 12/25/22 1841 12/25/22 2229   12/25/22 1830  cefTRIAXone (ROCEPHIN) 2 g in sodium chloride 0.9 % 100 mL IVPB        2 g 200 mL/hr over 30 Minutes Intravenous  Once 12/25/22 1820 12/25/22 1955         Subjective: Patient seen and examined.  No complaints.  Objective: Vitals:   12/27/22 2123 12/28/22 0501 12/28/22 0742 12/28/22 0844  BP: (!) 165/99 (!) 156/86 (!) 179/93 (!) 192/100  Pulse: 88 87 82 90  Resp:  18 18 18   Temp: 99.3 F (37.4 C) 98.2 F (36.8 C) 98.3 F (36.8 C) 98.6 F (37 C)  TempSrc: Oral  Oral Oral  SpO2: 98% 99% 99% 99%  Weight:    101.2 kg  Height:    5\' 11"  (1.803 m)   No intake or output data in the 24 hours ending 12/28/22 0938  Filed Weights   12/25/22 1628 12/28/22 0844  Weight: 101.2 kg 101.2 kg    Examination:  General exam: Appears calm and comfortable  Respiratory system: Clear to auscultation.  Respiratory effort normal. Cardiovascular system: S1 & S2 heard, RRR. No JVD, murmurs, rubs, gallops or clicks. No pedal edema. Gastrointestinal system: Abdomen is nondistended, soft and nontender. No organomegaly or masses felt. Normal bowel sounds heard. Central nervous system: Alert and oriented. No focal neurological deficits. Extremities: Erythema and edema as well as tenderness of the right great toe. Psychiatry: Judgement and insight appear normal. Mood & affect appropriate.     Data Reviewed: I have personally reviewed following labs and imaging studies  CBC: Recent Labs  Lab 12/23/22 1236 12/25/22 1929 12/26/22 0019 12/27/22 0734  WBC 12.3* 12.6* 11.5* 12.2*  NEUTROABS 9.2* 10.3*  --   --   HGB  15.9 15.5 14.3 15.0  HCT 46.0 44.5 40.7 42.9  MCV 83.6 82.1 82.9 83.6  PLT 214 254 240 286    Basic Metabolic Panel: Recent Labs  Lab 12/23/22 1236 12/25/22 1929 12/26/22 0019 12/27/22 0734  NA 134* 133* 132* 135  K 3.5 3.9 3.9 3.5  CL 101 98 97* 101  CO2 25 25 24 23   GLUCOSE 391* 341* 310* 201*  BUN 15 27* 21 17  CREATININE 1.06 1.14 1.08 1.12  CALCIUM 9.0 9.4 8.5* 8.4*    GFR: Estimated Creatinine Clearance: 81.8 mL/min (by C-G formula based on SCr of 1.12 mg/dL). Liver Function Tests: Recent Labs  Lab 12/23/22 1236 12/25/22 1929 12/27/22 0734  AST 20 18 25   ALT 29 27 30   ALKPHOS 79 89 91  BILITOT 2.9* 1.4* 1.4*  PROT 7.9 8.3* 7.2  ALBUMIN 4.1 4.0 3.3*    No results for input(s): "LIPASE", "AMYLASE" in the last 168 hours. No results for input(s): "AMMONIA" in the last 168 hours. Coagulation Profile: No results for input(s): "INR", "PROTIME" in the last 168 hours. Cardiac Enzymes: No results for input(s): "CKTOTAL", "CKMB", "CKMBINDEX", "TROPONINI" in the last 168 hours. BNP (last 3 results) No results for input(s): "PROBNP" in the last 8760 hours. HbA1C: Recent Labs    12/25/22 1929 12/26/22 1654  HGBA1C 10.0* 9.7*    CBG: Recent Labs  Lab  12/27/22 1706 12/27/22 1934 12/27/22 2037 12/28/22 0755 12/28/22 0905  GLUCAP 173* 176* 172* 165* 153*    Lipid Profile: Recent Labs    12/26/22 0019  CHOL 119  HDL 37*  LDLCALC 72  TRIG 52  CHOLHDL 3.2    Thyroid Function Tests: No results for input(s): "TSH", "T4TOTAL", "FREET4", "T3FREE", "THYROIDAB" in the last 72 hours. Anemia Panel: No results for input(s): "VITAMINB12", "FOLATE", "FERRITIN", "TIBC", "IRON", "RETICCTPCT" in the last 72 hours. Sepsis Labs: Recent Labs  Lab 12/23/22 1503 12/23/22 1648 12/25/22 2128 12/26/22 0019  LATICACIDVEN 1.2 0.9 0.5 1.0     Recent Results (from the past 240 hour(s))  Blood culture (routine x 2)     Status: None (Preliminary result)   Collection Time: 12/23/22  3:01 PM   Specimen: BLOOD  Result Value Ref Range Status   Specimen Description   Final    BLOOD RIGHT ANTECUBITAL Performed at [facility], [address] [place], [place] [address]    Special Requests   Final    BOTTLES DRAWN AEROBIC AND ANAEROBIC Blood Culture adequate volume Performed at [facility], [address] [place], [place] [address]    Culture   Final    NO GROWTH 4 DAYS Performed at [facility], [address], [place], [place] [address]    Report Status PENDING  Incomplete  Blood culture (routine x 2)     Status: None (Preliminary result)   Collection Time: 12/23/22  3:21 PM   Specimen: BLOOD LEFT FOREARM  Result Value Ref Range Status   Specimen Description   Final    BLOOD LEFT FOREARM Performed at [facility], [address] [place], [place] [address]    Special Requests   Final    BOTTLES DRAWN AEROBIC AND ANAEROBIC Blood Culture results may not be optimal due to an inadequate volume of blood received in culture bottles Performed at [facility], [address] [place], [place] [address]    Culture   Final    NO GROWTH 4 DAYS Performed at [facility], [address] [place], [place] [address]    Report Status PENDING  Incomplete  Culture, blood (Routine X 2) w Reflex to ID Panel     Status: None (Preliminary result)   Collection Time: 12/25/22  7:29 PM   Specimen: BLOOD  Result Value Ref Range Status   Specimen Description   Final    BLOOD BLOOD LEFT ARM Performed at [facility], [address], [place], [place] [address]    Special Requests   Final    BOTTLES DRAWN AEROBIC AND ANAEROBIC Blood Culture results may not be optimal due to an excessive volume of blood received in culture bottles Performed at [facility], [address] [place], [place] [address]    Culture   Final    NO GROWTH 2 DAYS Performed at [facility], [address] [place], [place] [address]    Report Status PENDING  Incomplete  Culture, blood (Routine X 2) w Reflex to ID Panel     Status: None (Preliminary result)   Collection Time: 12/25/22  9:22 PM   Specimen: BLOOD  Result Value Ref Range Status   Specimen Description   Final    BLOOD BLOOD LEFT FOREARM Performed at [facility], [address], [place], [place] [address]    Special Requests   Final    BOTTLES DRAWN AEROBIC AND ANAEROBIC Blood Culture adequate volume Performed at [facility], [address] [place], [place] [address]    Culture   Final    NO GROWTH 1 DAY Performed at [facility], [address], [place], [place] [address]    Report Status PENDING  Incomplete     Radiology Studies: No results found.  Scheduled Meds:  chlorhexidine  60 mL Topical Once   [MAR Hold] enoxaparin (LOVENOX) injection  50 mg Subcutaneous Q24H   fentaNYL       [MAR Hold] insulin aspart  0-15 Units Subcutaneous TID WC   [MAR Hold] insulin aspart  0-5 Units Subcutaneous QHS   [MAR Hold] insulin glargine-yfgn  15 Units Subcutaneous Daily   [MAR Hold] lisinopril  40 mg Oral Daily   midazolam        Continuous Infusions:  sodium chloride 100 mL/hr at 12/27/22 1659    ceFAZolin (ANCEF) IV     [MAR Hold] cefTRIAXone (ROCEPHIN)  IV 2 g (12/27/22 1701)   lactated ringers 10 mL/hr at 12/28/22 0917   [MAR Hold] metronidazole 500 mg (12/27/22 2128)   [MAR Hold] vancomycin 1,500 mg (12/27/22 2304)     LOS: 3 days   Hughie Closs, MD [facility]  12/28/2022, 9:38 AM   *Please note that this is a verbal dictation therefore any spelling or grammatical errors are due to the "Dragon Medical One" system interpretation.  Please page via Amion and do not message via secure chat for urgent patient care matters. Secure chat can be used for non urgent patient care matters.  How to contact the [facility] Attending or Consulting provider 7A - 7P or covering provider during after hours 7P -7A, for this patient?  Check the care team in [facility] and look for a) attending/consulting TRH provider listed and b) the [facility] team listed. Page or secure chat 7A-7P. Log into [email or web address] and use [facility]'s universal password to access. If you do not have the password, please contact the hospital operator. Locate the [facility] provider you are looking for under [facility] and page to a number that you can be directly reached. If you still have difficulty reaching the provider, please page the [facility] (Director on Call) for the Hospitalists listed on amion for assistance.

## 2022-12-28 NOTE — Inpatient Diabetes Management (Signed)
Inpatient Diabetes Program Recommendations  AACE/ADA: New Consensus Statement on Inpatient Glycemic Control (2015)  Target Ranges:  Prepandial:   less than 140 mg/dL      Peak postprandial:   less than 180 mg/dL (1-2 hours)      Critically ill patients:  140 - 180 mg/dL   Lab Results  Component Value Date   GLUCAP 169 (H) 12/28/2022   HGBA1C 9.7 (H) 12/26/2022    Latest Reference Range & Units 12/28/22 07:55 12/28/22 09:05 12/28/22 11:43  Glucose-Capillary 70 - 99 mg/dL 811 (H) 914 (H) 782 (H)  (H): Data is abnormally high  Diabetes history: DM2 Outpatient Diabetes medications: None Current orders for Inpatient glycemic control: Semglee 15 units, Novolog 0-15 units tid, 0-5 units hs  Inpatient Diabetes Program Recommendations:    Spoke with pt about A1C results 9.7 (average blood glucose 240 over the past 2-3 months) and explained what an A1C is, basic pathophysiology of DM Type 2, basic home care, basic diabetes diet nutrition principles, importance of checking CBGs and maintaining good CBG control to prevent long-term and short-term complications. Reviewed signs and symptoms of hyperglycemia and hypoglycemia and how to treat hypoglycemia at home. Also reviewed blood sugar goals at home.  RNs to provide ongoing basic DM education at bedside with this patient. Have ordered educational booklet and insulin starter kit.  Educated patient, girlfriend, and sons on insulin pen use at home. Reviewed contents of insulin flexpen starter kit. Reviewed all steps if insulin pen including attachment of needle, 2-unit air shot, dialing up dose, giving injection, removing needle, disposal of sharps, storage of unused insulin, disposal of insulin etc. Patient able to provide successful return demonstration. Also reviewed troubleshooting with insulin pen. MD to give patient Rxs for insulin pens and insulin pen needles.   MD ordered application of Freestyle CGM at discharge for patient. Education done  regarding application and changing CGM sensor (alternate every 14 days on back of arms), 1 hour warm-up and use of glucometer when alert displays. Patient has also been given educational packet regarding use CGM sensor including the 1-800 toll free number for any questions, problems or needs related to the [facility] sensors or reader.    Sensor applied by patient to (L) Arm at 240p.  Explained that glucose readings will not be available until 1 hour after application. Reviewed use of CGM including how to scan, changing Sensor, Vitamin C warning, arrows with glucose readings, and Freestyle app.   Thank you, Billy Fischer. Riely Baskett, RN, MSN, CDE  Diabetes Coordinator Inpatient Glycemic Control Team Team Pager [phone] (8am-5pm) 12/28/2022 2:44 PM

## 2022-12-28 NOTE — Anesthesia Procedure Notes (Signed)
Anesthesia Regional Block: Adductor canal block   Pre-Anesthetic Checklist: , timeout performed,  Correct Patient, Correct Site, Correct Laterality,  Correct Procedure, Correct Position, site marked,  Risks and benefits discussed,  Surgical consent,  Pre-op evaluation,  At surgeon's request and post-op pain management  Laterality: Right  Prep: chloraprep       Needles:  Injection technique: Single-shot  Needle Type: Echogenic Stimulator Needle     Needle Length: 9cm  Needle Gauge: 21     Additional Needles:   Procedures:,,,, ultrasound used (permanent image in chart),,    Narrative:  Start time: 12/28/2022 9:33 AM End time: 12/28/2022 9:35 AM Injection made incrementally with aspirations every 5 mL.  Performed by: Personally  Anesthesiologist: Linton Rump, MD  Additional Notes: Discussed risks and benefits of nerve block including, but not limited to, prolonged and/or permanent nerve injury involving sensory and/or motor function. Monitors were applied and a time-out was performed. The nerve and associated structures were visualized under ultrasound guidance. After negative aspiration, local anesthetic was slowly injected around the nerve. There was no evidence of high pressure during the procedure. There were no paresthesias. VSS remained stable and the patient tolerated the procedure well.

## 2022-12-28 NOTE — Progress Notes (Signed)
Pt left unit for procedure in stable condition. Daughter at side and has pt's cell phone. All other belongings left at bedside.

## 2022-12-28 NOTE — Op Note (Signed)
12/25/2022 - 12/28/2022  11:27 AM  PATIENT:  Pedro Moyer    PRE-OPERATIVE DIAGNOSIS:  Osteomyelitis Right Great Toe  POST-OPERATIVE DIAGNOSIS:  Same  PROCEDURE:  RIGHT 1ST RAY AMPUTATION Application Kerecis micro graft 38 cm. Local tissue rearrangement for wound closure 11 x 4 cm. Application of Prevena 13 cm wound VAC.  SURGEON:  Nadara Mustard, MD  PHYSICIAN ASSISTANT:None ANESTHESIA:   General  PREOPERATIVE INDICATIONS:  [name] is a  62 y.o. male with a diagnosis of Osteomyelitis Right Great Toe who failed conservative measures and elected for surgical management.    The risks benefits and alternatives were discussed with the patient preoperatively including but not limited to the risks of infection, bleeding, nerve injury, cardiopulmonary complications, the need for revision surgery, among others, and the patient was willing to proceed.  OPERATIVE IMPLANTS:   Implant Name Type Inv. Item Serial No. Manufacturer Lot No. LRB No. Used Action  GRAFT SKIN WND MICRO 38 - [id number] Tissue GRAFT SKIN WND MICRO 38  [employer] [id number] Right 1 Implanted    @ENCIMAGES @  OPERATIVE FINDINGS: Tissue margins were clear.  OPERATIVE PROCEDURE: Patient was brought the operating room after undergoing a regional anesthetic.  After adequate levels anesthesia obtained patient's right lower extremity was prepped patient DuraPrep draped into a sterile field a timeout was called.  A racquet incision was made around the ulcerative tissue in the first ray.  The first ray was resected through the base of the first metatarsal.  Electrocautery was used for hemostasis the wound was irrigated with normal saline after resection of the ray the wound was 4 x 11 cm.  Kerecis micro graft 38 cm was applied within the wound bed.  Local tissue rearrangement was used to close the wound 4 x 11 cm.  2-0 nylon was used for wound closure.  A Prevena 13 cm wound VAC was applied this had a good suction fit  patient was taken the PACU in stable condition.   DISCHARGE PLANNING:  Antibiotic duration: Continue antibiotics for 24 hours postoperatively  Weightbearing: Touchdown weightbearing on the right.  Pain medication: Opioid pathway  Dressing care/ Wound VAC: Wound VAC continue at discharge  Ambulatory devices: Walker  Discharge to: Anticipate discharge to home.  Follow-up: In the office 1 week post operative.

## 2022-12-28 NOTE — TOC Benefit Eligibility Note (Signed)
Patient Product/process development scientist completed.    The patient is currently admitted and upon discharge could be taking Freestyle [employer].  Require Prior Authorization  The patient is currently admitted and upon discharge could be taking Dexcom G7Sensors.  Require Prior Authorization  The patient is insured through [employer]   This test claim was processed through [employer]- copay amounts may vary at other pharmacies due to [employer], or as the patient moves through the different stages of their insurance plan.  Roland Earl, CPHT Pharmacy Patient Advocate Specialist [facility] Patient Advocate Team Direct Number: [phone]  Fax: [phone]

## 2022-12-28 NOTE — Transfer of Care (Signed)
Immediate Anesthesia Transfer of Care Note  Patient: Pedro Moyer  Procedure(s) Performed: RIGHT 1ST RAY AMPUTATION (Right: First Toe)  Patient Location: PACU  Anesthesia Type:MAC and Regional  Level of Consciousness: oriented and drowsy  Airway & Oxygen Therapy: Patient Spontanous Breathing and Patient connected to face mask oxygen  Post-op Assessment: Report given to RN, Post -op Vital signs reviewed and stable, and Patient moving all extremities  Post vital signs: stable  Last Vitals:  Vitals Value Taken Time  BP 131/86 12/28/22 1140  Temp    Pulse 84 12/28/22 1142  Resp 25 12/28/22 1142  SpO2 99 % 12/28/22 1142  Vitals shown include unvalidated device data.  Last Pain:  Vitals:   12/28/22 0915  TempSrc:   PainSc: 0-No pain      Patients Stated Pain Goal: 0 (12/25/22 2333)  Complications: No notable events documented.

## 2022-12-28 NOTE — Telephone Encounter (Signed)
Patient Advocate Encounter   Received notification that prior authorization for Dexcom G7 Sensor is required.   PA submitted on 12/28/2022 Key [id number] Insurance [facility] [place] Commercial Electronic Request Form Status is pending       Roland Earl, CPhT Pharmacy Patient Advocate Specialist [facility] Pharm[facility] Patient Advocate Team Direct Number: [phone]  Fax: [phone]

## 2022-12-28 NOTE — Progress Notes (Signed)
Pharmacy Antibiotic Note  Pedro Moyer is a 62 y.o. male admitted on 12/25/2022 with  wound infection .  Rocephin 2 g IV q24h and Flagyl 500 mg IV q12h ordered by provider.  In addition, Pharmacy has been consulted for vancomycin dosing.  Pt with toe osteo. Plan for amputation today. D3 vanc/ceftriaxone/flagyl  Plan: Continue Rocephin and Flagyl per provider Continue vancomycin 1500 mg IV every 24 hours (eAUC 470.3, SCr used: 1.14) F/u abx post amputation   Height: 5\' 11"  (180.3 cm) Weight: 101.2 kg (223 lb 1.7 oz) IBW/kg (Calculated) : 75.3  Temp (24hrs), Avg:98.8 F (37.1 C), Min:98.2 F (36.8 C), Max:99.3 F (37.4 C)  Recent Labs  Lab 12/23/22 1236 12/23/22 1503 12/23/22 1648 12/25/22 1929 12/25/22 2128 12/26/22 0019 12/27/22 0734  WBC 12.3*  --   --  12.6*  --  11.5* 12.2*  CREATININE 1.06  --   --  1.14  --  1.08 1.12  LATICACIDVEN  --  1.2 0.9  --  0.5 1.0  --      Estimated Creatinine Clearance: 81.8 mL/min (by C-G formula based on SCr of 1.12 mg/dL).    Allergies  Allergen Reactions   Codeine Palpitations    Antimicrobials this admission: 6/4 Rocephin >>  6/4 Flagyl >>  6/4 vancomycin >>  Microbiology results: 6/4 BCx: ngtd   Ulyses Southward, PharmD, BCIDP, AAHIVP, CPP Infectious Disease Pharmacist 12/28/2022 7:19 AM

## 2022-12-28 NOTE — Anesthesia Procedure Notes (Signed)
Anesthesia Regional Block: Popliteal block   Pre-Anesthetic Checklist: , timeout performed,  Correct Patient, Correct Site, Correct Laterality,  Correct Procedure, Correct Position, site marked,  Risks and benefits discussed,  Surgical consent,  Pre-op evaluation,  At surgeon's request and post-op pain management  Laterality: Right  Prep: chloraprep       Needles:  Injection technique: Single-shot  Needle Type: Echogenic Stimulator Needle     Needle Length: 9cm  Needle Gauge: 21     Additional Needles:   Procedures:,,,, ultrasound used (permanent image in chart),,    Narrative:  Start time: 12/28/2022 9:28 AM End time: 12/28/2022 9:31 AM Injection made incrementally with aspirations every 5 mL.  Performed by: Personally  Anesthesiologist: Linton Rump, MD  Additional Notes: Discussed risks and benefits of nerve block including, but not limited to, prolonged and/or permanent nerve injury involving sensory and/or motor function. Monitors were applied and a time-out was performed. The nerve and associated structures were visualized under ultrasound guidance. After negative aspiration, local anesthetic was slowly injected around the nerve. There was no evidence of high pressure during the procedure. There were no paresthesias. VSS remained stable and the patient tolerated the procedure well.

## 2022-12-28 NOTE — Progress Notes (Addendum)
Inpatient Rehabilitation Admissions Coordinator   [facility] [employer] is out of network with [facility]. Other in network venues should be pursued. We will sign off.  Ottie Glazier, RN, MSN Rehab Admissions Coordinator [phone] 12/28/2022 3:17 PM

## 2022-12-28 NOTE — Anesthesia Postprocedure Evaluation (Signed)
Anesthesia Post Note  Patient: Pedro Moyer  Procedure(s) Performed: RIGHT 1ST RAY AMPUTATION (Right: First Toe)     Patient location during evaluation: PACU Anesthesia Type: Regional Level of consciousness: awake and alert, patient cooperative and oriented Pain management: pain level controlled Vital Signs Assessment: post-procedure vital signs reviewed and stable Respiratory status: spontaneous breathing, nonlabored ventilation and respiratory function stable Cardiovascular status: stable and blood pressure returned to baseline Postop Assessment: no apparent nausea or vomiting Anesthetic complications: no   No notable events documented.  Last Vitals:  Vitals:   12/28/22 1200 12/28/22 1215  BP: (!) 152/87 (!) 155/88  Pulse: 84 78  Resp: (!) 22 18  Temp:  36.7 C  SpO2: 95% 97%    Last Pain:  Vitals:   12/28/22 1215  TempSrc:   PainSc: 0-No pain                 Muaz Shorey,E. Nolin Grell

## 2022-12-28 NOTE — Interval H&P Note (Signed)
History and Physical Interval Note:  12/28/2022 6:43 AM  Pedro Moyer  has presented today for surgery, with the diagnosis of Osteomyelitis Right Great Toe.  The various methods of treatment have been discussed with the patient and family. After consideration of risks, benefits and other options for treatment, the patient has consented to  Procedure(s): RIGHT 1ST RAY AMPUTATION (Right) as a surgical intervention.  The patient's history has been reviewed, patient examined, no change in status, stable for surgery.  I have reviewed the patient's chart and labs.  Questions were answered to the patient's satisfaction.     Nadara Mustard

## 2022-12-29 ENCOUNTER — Other Ambulatory Visit: Payer: Self-pay

## 2022-12-29 ENCOUNTER — Encounter (HOSPITAL_COMMUNITY): Payer: Self-pay | Admitting: Orthopedic Surgery

## 2022-12-29 DIAGNOSIS — E11628 Type 2 diabetes mellitus with other skin complications: Secondary | ICD-10-CM | POA: Diagnosis not present

## 2022-12-29 DIAGNOSIS — L089 Local infection of the skin and subcutaneous tissue, unspecified: Secondary | ICD-10-CM | POA: Diagnosis not present

## 2022-12-29 LAB — CBC WITH DIFFERENTIAL/PLATELET
Abs Immature Granulocytes: 0.05 10*3/uL (ref 0.00–0.07)
Basophils Absolute: 0.1 10*3/uL (ref 0.0–0.1)
Basophils Relative: 1 %
Eosinophils Absolute: 0.1 10*3/uL (ref 0.0–0.5)
Eosinophils Relative: 1 %
HCT: 34.5 % — ABNORMAL LOW (ref 39.0–52.0)
Hemoglobin: 11.7 g/dL — ABNORMAL LOW (ref 13.0–17.0)
Immature Granulocytes: 1 %
Lymphocytes Relative: 18 %
Lymphs Abs: 1.9 10*3/uL (ref 0.7–4.0)
MCH: 28.1 pg (ref 26.0–34.0)
MCHC: 33.9 g/dL (ref 30.0–36.0)
MCV: 82.9 fL (ref 80.0–100.0)
Monocytes Absolute: 1.4 10*3/uL — ABNORMAL HIGH (ref 0.1–1.0)
Monocytes Relative: 14 %
Neutro Abs: 6.9 10*3/uL (ref 1.7–7.7)
Neutrophils Relative %: 65 %
Platelets: 256 10*3/uL (ref 150–400)
RBC: 4.16 MIL/uL — ABNORMAL LOW (ref 4.22–5.81)
RDW: 13 % (ref 11.5–15.5)
WBC: 10.3 10*3/uL (ref 4.0–10.5)
nRBC: 0 % (ref 0.0–0.2)

## 2022-12-29 LAB — GLUCOSE, CAPILLARY
Glucose-Capillary: 151 mg/dL — ABNORMAL HIGH (ref 70–99)
Glucose-Capillary: 220 mg/dL — ABNORMAL HIGH (ref 70–99)
Glucose-Capillary: 243 mg/dL — ABNORMAL HIGH (ref 70–99)
Glucose-Capillary: 172 mg/dL — ABNORMAL HIGH (ref 70–99)

## 2022-12-29 LAB — CULTURE, BLOOD (ROUTINE X 2)

## 2022-12-29 LAB — BASIC METABOLIC PANEL
Anion gap: 8 (ref 5–15)
BUN: 15 mg/dL (ref 8–23)
CO2: 23 mmol/L (ref 22–32)
Calcium: 8.3 mg/dL — ABNORMAL LOW (ref 8.9–10.3)
Chloride: 103 mmol/L (ref 98–111)
Creatinine, Ser: 1.06 mg/dL (ref 0.61–1.24)
GFR, Estimated: >60 mL/min (ref >60)
Glucose, Bld: 165 mg/dL — ABNORMAL HIGH (ref 70–99)
Potassium: 4.1 mmol/L (ref 3.5–5.1)
Sodium: 134 mmol/L — ABNORMAL LOW (ref 135–145)

## 2022-12-29 MED ORDER — HYDRALAZINE HCL 25 MG PO TABS: 25.0000 mg | ORAL_TABLET | Freq: Four times a day (QID) | ORAL | Status: DC | PRN

## 2022-12-29 MED ORDER — SODIUM CHLORIDE 0.9 % IV SOLN
12.5000 mg | Freq: Four times a day (QID) | INTRAVENOUS | Status: DC | PRN
  Administered 2022-12-29: 12.5 mg via INTRAVENOUS
  Filled 2022-12-29: qty 12.5

## 2022-12-29 MED ORDER — AMLODIPINE BESYLATE 5 MG PO TABS
5.0000 mg | ORAL_TABLET | Freq: Every day | ORAL | Status: DC
  Administered 2022-12-29 – 2022-12-30 (×2): 5 mg via ORAL
  Filled 2022-12-29 (×2): qty 1

## 2022-12-29 NOTE — Progress Notes (Signed)
Orthopedic Tech Progress Note Patient Details:  BODIN GORKA [birth year] [id number]  Ortho Devices Type of Ortho Device: Postop shoe/boot Ortho Device/Splint Location: RLE Ortho Device/Splint Interventions: Ordered, Application, Adjustment   Post Interventions Patient Tolerated: Well Instructions Provided: Care of device, Adjustment of device  Christien Frankl Carmine Savoy 12/29/2022, 2:22 PM

## 2022-12-29 NOTE — Evaluation (Signed)
Physical Therapy Evaluation Patient Details Name: Pedro Moyer MRN: [id number] DOB: 10-27-1960 Today's Date: 12/29/2022  History of Present Illness  Pt is 62 yo male who presents on 12/23/22 with osteomyelitis of the R great toe, underwent 1st ray amputation on 6/7. PMH: DM2, HTN, OSA.  Clinical Impression  Pt admitted with above diagnosis. Pt from home with teenage sons, independent, works full time. Post op shoe not in pt's room, called ortho tech and they are bringing. Educated pt on WB'ing status with shoe but kept him NWB on eval since he did not have it. Pt ambulated 15x 2x with RW and min A, was lethargic due to meds received earlier. Educated pt on HEP for ROM and balance. Will follow acutely but do not anticipate him needing PT at d/c.  Pt currently with functional limitations due to the deficits listed below (see PT Problem List). Pt will benefit from acute skilled PT to increase their independence and safety with mobility to allow discharge.          Recommendations for follow up therapy are one component of a multi-disciplinary discharge planning process, led by the attending physician.  Recommendations may be updated based on patient status, additional functional criteria and insurance authorization.  Follow Up Recommendations       Assistance Recommended at Discharge Intermittent Supervision/Assistance  Patient can return home with the following  A little help with walking and/or transfers;A little help with bathing/dressing/bathroom;Assistance with cooking/housework;Assist for transportation;Help with stairs or ramp for entrance    Equipment Recommendations Rolling walker (2 wheels)  Recommendations for Other Services       Functional Status Assessment Patient has had a recent decline in their functional status and demonstrates the ability to make significant improvements in function in a reasonable and predictable amount of time.     Precautions / Restrictions  Precautions Precautions: None Required Braces or Orthoses: Other Brace Other Brace: post op shoe Restrictions Weight Bearing Restrictions: Yes RLE Weight Bearing: Touchdown weight bearing RLE Partial Weight Bearing Percentage or Pounds: through heel only Other Position/Activity Restrictions: wound vac      Mobility  Bed Mobility Overal bed mobility: Independent                  Transfers Overall transfer level: Needs assistance Equipment used: Rolling walker (2 wheels) Transfers: Sit to/from Stand Sit to Stand: Min guard           General transfer comment: min guard for safety due to lethargy, vc's for hand placement    Ambulation/Gait Ambulation/Gait assistance: Min guard Gait Distance (Feet): 15 Feet Assistive device: Rolling walker (2 wheels) Gait Pattern/deviations: Step-to pattern Gait velocity: decreased Gait velocity interpretation: <1.8 ft/sec, indicate of risk for recurrent falls   General Gait Details: pt does not have post op shoe yet so kept RLE NWB, min guard for safety  Stairs            Wheelchair Mobility    Modified Rankin (Stroke Patients Only)       Balance Overall balance assessment: Mild deficits observed, not formally tested                                           Pertinent Vitals/Pain Pain Assessment Pain Assessment: No/denies pain    Home Living Family/patient expects to be discharged to:: Private residence Living Arrangements: Children Available Help at Discharge:  Available PRN/intermittently Type of Home: House Home Access: Stairs to enter Entrance Stairs-Rails: None Entrance Stairs-Number of Steps: 2   Home Layout: One level Home Equipment: Other (comment) (knee scooter) Additional Comments: lives with his 2 sons, 85 and 22 yo    Prior Function Prior Level of Function : Independent/Modified Independent;Working/employed;Driving                     Hand Dominance   Dominant  Hand: Right    Extremity/Trunk Assessment   Upper Extremity Assessment Upper Extremity Assessment: Overall WFL for tasks assessed    Lower Extremity Assessment Lower Extremity Assessment: RLE deficits/detail RLE Deficits / Details: mimimal stiffness R ankle RLE Sensation: decreased light touch;history of peripheral neuropathy RLE Coordination: decreased gross motor    Cervical / Trunk Assessment Cervical / Trunk Assessment: Normal  Communication   Communication: No difficulties  Cognition Arousal/Alertness: Lethargic, Suspect due to medications Behavior During Therapy: WFL for tasks assessed/performed Overall Cognitive Status: Within Functional Limits for tasks assessed                                          General Comments General comments (skin integrity, edema, etc.): VSS. Pt had recently received Phenergan for nausea. Plans to go home to daughter's house (1 level as well) for short term    Exercises General Exercises - Lower Extremity Ankle Circles/Pumps: AROM, Both, 10 reps, Seated Long Arc Quad: AROM, Right, 10 reps, Seated Heel Slides: AROM, Right, 10 reps, Seated   Assessment/Plan    PT Assessment Patient needs continued PT services  PT Problem List Decreased balance;Decreased mobility;Decreased knowledge of use of DME;Decreased knowledge of precautions;Decreased safety awareness;Decreased skin integrity;Impaired sensation;Pain       PT Treatment Interventions DME instruction;Gait training;Stair training;Functional mobility training;Therapeutic activities;Therapeutic exercise;Patient/family education    PT Goals (Current goals can be found in the Care Plan section)  Acute Rehab PT Goals Patient Stated Goal: return to home and work PT Goal Formulation: With patient Time For Goal Achievement: 01/12/23 Potential to Achieve Goals: Good    Frequency Min 3X/week     Co-evaluation               AM-PAC PT "6 Clicks" Mobility  Outcome  Measure Help needed turning from your back to your side while in a flat bed without using bedrails?: None Help needed moving from lying on your back to sitting on the side of a flat bed without using bedrails?: None Help needed moving to and from a bed to a chair (including a wheelchair)?: None Help needed standing up from a chair using your arms (e.g., wheelchair or bedside chair)?: A Little Help needed to walk in hospital room?: A Little Help needed climbing 3-5 steps with a railing? : A Little 6 Click Score: 21    End of Session Equipment Utilized During Treatment: Gait belt Activity Tolerance: Patient tolerated treatment well Patient left: in chair;with call bell/phone within reach Nurse Communication: Mobility status PT Visit Diagnosis: Unsteadiness on feet (R26.81);Difficulty in walking, not elsewhere classified (R26.2);Pain Pain - Right/Left: Right Pain - part of body: Ankle and joints of foot    Time: 1201-1238 PT Time Calculation (min) (ACUTE ONLY): 37 min   Charges:   PT Evaluation $PT Eval Moderate Complexity: 1 Mod PT Treatments $Gait Training: 8-22 mins        Lyanne Co, PT  Acute Rehab Services Secure chat preferred Office [phone]   Lawana Chambers Justino Boze 12/29/2022, 12:40 PM

## 2022-12-29 NOTE — Progress Notes (Signed)
Patient ID: Pedro Moyer, male   DOB: 11-18-1960, 62 y.o.   MRN: [id number] Patient is postoperative day 1 first ray amputation right foot.  There is 50 cc in the wound VAC canister.  The VAC was plugged into the bed and not getting a charge.  This was plugged into the wall and the wound VAC has a full battery.  Patient will need discharge with the Praveena plus portable wound VAC pump at time of discharge.  I will follow-up in the office 1 week after discharge.

## 2022-12-29 NOTE — TOC Progression Note (Signed)
Transition of Care [facility]) - Progression Note    Patient Details  Name: Pedro Moyer MRN: [id number] Date of Birth: 1961/06/11  [facility] CM/SW Contact  Ronny Bacon, RN Phone Number: 12/29/2022, 2:57 PM  Clinical Narrative:   Spoke with patient by phone. Informed patient that a rolling walker was ordered for him to have at home and that it will be delivered to his bedside. TOC will continue to monitor for any additional needs for discharge.    Expected Discharge Plan: Home/Self Care Barriers to Discharge: Continued Medical Work up  Expected Discharge Plan and Services   Discharge Planning Services: CM Consult   Living arrangements for the past 2 months: Single Family Home                 DME Arranged: N/A DME Agency: NA                   Social Determinants of Health (SDOH) Interventions SDOH Screenings   Food Insecurity: No Food Insecurity (12/26/2022)  Housing: Patient Declined (12/26/2022)  Transportation Needs: No Transportation Needs (12/26/2022)  Utilities: Not At Risk (12/26/2022)  Tobacco Use: Low Risk  (12/29/2022)    Readmission Risk Interventions    12/26/2022   11:03 AM  Readmission Risk Prevention Plan  Post Dischage Appt Complete  Medication Screening Complete  Transportation Screening Complete

## 2022-12-29 NOTE — Progress Notes (Signed)
PROGRESS NOTE    Pedro Moyer  WUJ:[id number] DOB: Aug 01, 1960 DOA: 12/25/2022 PCP: Pcp, No   Brief Narrative:  Pedro Moyer is a 62 y.o. male with past medical history significant for type 2 diabetes mellitus, essential hypertension, OSA who presented to [facility] ED on 6/4 with progressive redness, swelling and pain to his right great toe.  Patient reports onset roughly 2 weeks ago as he was walking long distances at [employer] and subsequently developed blisters on his right big toe.  On the last day of his trip, patient popped the blister and put some antibiotic cream overlying this.  Since patient has been soaking his foot in Epsom salt.  He also went to the ED on Sunday and given a Tdap vaccination and oral Keflex.  Despite antibiotic use his symptoms continued to progress/worsen.  Denies fever/chills.   In regards to his diabetes, currently diet controlled over the last few years.  He reports that he lost 80 pounds and was able to stop his medication.  He is currently not followed by a physician and does not regularly check his blood sugars at home.  Additionally he was recently started on hydrochlorothiazide for his uncontrolled blood pressure after he was seen in the ED as well.   In the ED, temperature 100.9 F.  HR 97, RR 18, BP 188/99, SpO2 100% on room air.  WBC 12.6, hemoglobin 15.5, platelet 254.  Sodium 133, potassium 3.9, chloride 98, CO2 25, glucose 341, BUN 27, creatinine 1.14.  AST 18, ALT 27, total bilirubin 1.4.  CRP 10.6.  Lactic acid 0.5.  Right foot x-ray with soft tissue swelling over the right first toe, no acute bony abnormalities, no radiopaque foreign bodies identified.  Orthopedics was consulted.  TRH consulted for admission for further evaluation and management of diabetic foot wound.  Assessment & Plan:   Principal Problem:   Sepsis from diabetic infection of right foot (HCC) Active Problems:   Uncontrolled type 2 diabetes mellitus with  hyperglycemia (HCC)   Essential hypertension   Obstructive sleep apnea   Sepsis (HCC)   Osteomyelitis of great toe of right foot (HCC)   Achilles tendon contracture, right  Right great toe osteomyelitis/abscess/cellulitis/Myositis MR right foot with and without contrast with fluid collection plantar aspect distal phalanx of the first digit with surrounding edema and enhancement concerning for abscess and early osteomyelitis; myopathy/myositis.  Seen by orthopedics/Dr. 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  Antibiotics to be completed today per Ortho recommendation.  To be seen by PT OT.  Pain is controlled.  He has wound VAC.   Uncontrolled type 2 diabetes mellitus with hyperglycemia: Blood glucose elevated 341 on admission.  Currently diet controlled at baseline.  Previously on metformin.  Hemoglobin A1c 9.7.  On 15 units of Semglee and SSI and blood sugar now better controlled.   Essential hypertension Blood pressure significantly elevated despite of being on lisinopril 40 mg p.o. daily.  Add amlodipine 5 mg p.o. daily.  Continue as needed hydralazine.   OSA -- Continue nocturnal CPAP  DVT prophylaxis: SCD's Start: 12/28/22 1310Lovenox   Code Status: Full Code  Family Communication: None present at bedside.  Plan of care discussed with patient in length and he/she verbalized understanding and agreed with it.  Status is: Inpatient Remains inpatient appropriate because: Has wound VAC.  To be seen by PT OT.   Estimated body mass index is 31.12 kg/m as calculated from the following:   Height as of  this encounter: 5\' 11"  (1.803 m).   Weight as of this encounter: 101.2 kg.    Nutritional Assessment: Body mass index is 31.12 kg/m.Marland Kitchen Seen by dietician.  I agree with the assessment and plan as outlined below: Nutrition Status:        . Skin Assessment: I have examined the patient's skin and I agree with the wound assessment as performed by the wound care RN as outlined  below:    Consultants:  Orthopedics  Procedures:  As above  Antimicrobials:  Anti-infectives (From admission, onward)    Start     Dose/Rate Route Frequency Ordered Stop   12/28/22 1700  ceFAZolin (ANCEF) IVPB 2g/100 mL premix  Status:  Discontinued        2 g 200 mL/hr over 30 Minutes Intravenous Every 8 hours 12/28/22 1309 12/28/22 1719   12/28/22 0600  ceFAZolin (ANCEF) IVPB 2g/100 mL premix        2 g 200 mL/hr over 30 Minutes Intravenous On call to O.R. 12/27/22 1834 12/28/22 1112   12/26/22 2200  vancomycin (VANCOREADY) IVPB 1500 mg/300 mL        1,500 mg 150 mL/hr over 120 Minutes Intravenous Every 24 hours 12/25/22 2108 12/29/22 0017   12/26/22 1800  cefTRIAXone (ROCEPHIN) 2 g in sodium chloride 0.9 % 100 mL IVPB        2 g 200 mL/hr over 30 Minutes Intravenous Every 24 hours 12/25/22 2059 12/28/22 1747   12/25/22 2200  metroNIDAZOLE (FLAGYL) IVPB 500 mg        500 mg 100 mL/hr over 60 Minutes Intravenous Every 12 hours 12/25/22 2059 12/29/22 1200   12/25/22 1845  vancomycin (VANCOREADY) IVPB 1750 mg/350 mL        1,750 mg 175 mL/hr over 120 Minutes Intravenous  Once 12/25/22 1841 12/25/22 2229   12/25/22 1830  cefTRIAXone (ROCEPHIN) 2 g in sodium chloride 0.9 % 100 mL IVPB        2 g 200 mL/hr over 30 Minutes Intravenous  Once 12/25/22 1820 12/25/22 1955         Subjective: Patient seen and examined.  No complaints.  Pain controlled.  He does not think he needs any sort of rehabilitation.  Objective: Vitals:   12/28/22 1942 12/28/22 2218 12/29/22 0437 12/29/22 0740  BP: (!) 173/87 (!) 161/80 (!) 147/82 (!) 166/90  Pulse: 93  83 73  Resp: 15  15 18   Temp: 99.4 F (37.4 C)  99.4 F (37.4 C) 98.1 F (36.7 C)  TempSrc: Oral   Oral  SpO2: 99%  100% 99%  Weight:      Height:        Intake/Output Summary (Last 24 hours) at 12/29/2022 1021 Last data filed at 12/29/2022 0700 Gross per 24 hour  Intake 3927.42 ml  Output 875 ml  Net 3052.42 ml    Filed  Weights   12/25/22 1628 12/28/22 0844  Weight: 101.2 kg 101.2 kg    Examination:  General exam: Appears calm and comfortable  Respiratory system: Clear to auscultation. Respiratory effort normal. Cardiovascular system: S1 & S2 heard, RRR. No JVD, murmurs, rubs, gallops or clicks. No pedal edema. Gastrointestinal system: Abdomen is nondistended, soft and nontender. No organomegaly or masses felt. Normal bowel sounds heard. Central nervous system: Alert and oriented. No focal neurological deficits. Extremities: Dressing in place on right foot with wound VAC attached. Psychiatry: Judgement and insight appear normal. Mood & affect appropriate.      Data Reviewed: I  have personally reviewed following labs and imaging studies  CBC: Recent Labs  Lab 12/23/22 1236 12/25/22 1929 12/26/22 0019 12/27/22 0734 12/29/22 0344  WBC 12.3* 12.6* 11.5* 12.2* 10.3  NEUTROABS 9.2* 10.3*  --   --  6.9  HGB 15.9 15.5 14.3 15.0 11.7*  HCT 46.0 44.5 40.7 42.9 34.5*  MCV 83.6 82.1 82.9 83.6 82.9  PLT 214 254 240 286 256    Basic Metabolic Panel: Recent Labs  Lab 12/25/22 1929 12/26/22 0019 12/27/22 0734 12/28/22 1336 12/29/22 0344  NA 133* 132* 135 134* 134*  K 3.9 3.9 3.5 3.6 4.1  CL 98 97* 101 102 103  CO2 25 24 23 24 23   GLUCOSE 341* 310* 201* 170* 165*  BUN 27* 21 17 12 15   CREATININE 1.14 1.08 1.12 0.99 1.06  CALCIUM 9.4 8.5* 8.4* 8.2* 8.3*    GFR: Estimated Creatinine Clearance: 86.5 mL/min (by C-G formula based on SCr of 1.06 mg/dL). Liver Function Tests: Recent Labs  Lab 12/23/22 1236 12/25/22 1929 12/27/22 0734  AST 20 18 25   ALT 29 27 30   ALKPHOS 79 89 91  BILITOT 2.9* 1.4* 1.4*  PROT 7.9 8.3* 7.2  ALBUMIN 4.1 4.0 3.3*    No results for input(s): "LIPASE", "AMYLASE" in the last 168 hours. No results for input(s): "AMMONIA" in the last 168 hours. Coagulation Profile: No results for input(s): "INR", "PROTIME" in the last 168 hours. Cardiac Enzymes: No results  for input(s): "CKTOTAL", "CKMB", "CKMBINDEX", "TROPONINI" in the last 168 hours. BNP (last 3 results) No results for input(s): "PROBNP" in the last 8760 hours. HbA1C: Recent Labs    12/26/22 1654  HGBA1C 9.7*    CBG: Recent Labs  Lab 12/28/22 1143 12/28/22 1449 12/28/22 1651 12/28/22 1937 12/29/22 0742  GLUCAP 169* 154* 229* 140* 151*    Lipid Profile: No results for input(s): "CHOL", "HDL", "LDLCALC", "TRIG", "CHOLHDL", "LDLDIRECT" in the last 72 hours.  Thyroid Function Tests: No results for input(s): "TSH", "T4TOTAL", "FREET4", "T3FREE", "THYROIDAB" in the last 72 hours. Anemia Panel: No results for input(s): "VITAMINB12", "FOLATE", "FERRITIN", "TIBC", "IRON", "RETICCTPCT" in the last 72 hours. Sepsis Labs: Recent Labs  Lab 12/23/22 1503 12/23/22 1648 12/25/22 2128 12/26/22 0019  LATICACIDVEN 1.2 0.9 0.5 1.0     Recent Results (from the past 240 hour(s))  Blood culture (routine x 2)     Status: None   Collection Time: 12/23/22  3:01 PM   Specimen: BLOOD  Result Value Ref Range Status   Specimen Description   Final    BLOOD RIGHT ANTECUBITAL Performed at [facility], [address], [place], [place] [address]    Special Requests   Final    BOTTLES DRAWN AEROBIC AND ANAEROBIC Blood Culture adequate volume Performed at [facility], [address] [place], [place] [address]    Culture   Final    NO GROWTH 5 DAYS Performed at [facility], [address] [place], [place] [address]    Report Status 12/28/2022 FINAL  Final  Blood culture (routine x 2)     Status: None   Collection Time: 12/23/22  3:21 PM   Specimen: BLOOD LEFT FOREARM  Result Value Ref Range Status   Specimen Description   Final    BLOOD LEFT FOREARM Performed at [facility], [address], [place], [place] [address]    Special Requests   Final    BOTTLES DRAWN AEROBIC AND ANAEROBIC Blood Culture results may not  be optimal due to an  inadequate volume of blood received in culture bottles Performed at [facility], [address] [place], [place] [address]    Culture   Final    NO GROWTH 5 DAYS Performed at [facility], [address], [place], [place] [address]    Report Status 12/28/2022 FINAL  Final  Culture, blood (Routine X 2) w Reflex to ID Panel     Status: None (Preliminary result)   Collection Time: 12/25/22  7:29 PM   Specimen: BLOOD  Result Value Ref Range Status   Specimen Description   Final    BLOOD BLOOD LEFT ARM Performed at [facility], [address] [place], [place] [address]    Special Requests   Final    BOTTLES DRAWN AEROBIC AND ANAEROBIC Blood Culture results may not be optimal due to an excessive volume of blood received in culture bottles Performed at [facility], [address] [place], [place] [address]    Culture   Final    NO GROWTH 4 DAYS Performed at [facility], [address] [place], [place] [address]    Report Status PENDING  Incomplete  Culture, blood (Routine X 2) w Reflex to ID Panel     Status: None (Preliminary result)   Collection Time: 12/25/22  9:22 PM   Specimen: BLOOD  Result Value Ref Range Status   Specimen Description   Final    BLOOD BLOOD LEFT FOREARM Performed at [facility], [address], [place], [place] [address]    Special Requests   Final    BOTTLES DRAWN AEROBIC AND ANAEROBIC Blood Culture adequate volume Performed at [facility], [address] [place], [place] [address]    Culture   Final    NO GROWTH 3 DAYS Performed at [facility], [address], [place], [place] [address]    Report Status PENDING  Incomplete  Surgical pcr screen     Status: None   Collection Time: 12/28/22  9:46 AM   Specimen: Nasal Mucosa; Nasal Swab  Result Value Ref Range Status   MRSA, PCR NEGATIVE NEGATIVE Final   Staphylococcus aureus  NEGATIVE NEGATIVE Final    Comment: (NOTE) The Xpert SA Assay (FDA approved for NASAL specimens in patients 80 years of age and older), is one component of a comprehensive surveillance program. It is not intended to diagnose infection nor to guide or monitor treatment. Performed at [facility], [address] [place], [place] [address]      Radiology Studies: No results found.  Scheduled Meds:  amLODipine  5 mg Oral Daily   vitamin C  1,000 mg Oral Daily   docusate sodium  100 mg Oral Daily   enoxaparin (LOVENOX) injection  50 mg Subcutaneous Q24H   insulin aspart  0-15 Units Subcutaneous TID WC   insulin aspart  0-5 Units Subcutaneous QHS   insulin glargine-yfgn  15 Units Subcutaneous Daily   lisinopril  40 mg Oral Daily   nutrition supplement (JUVEN)  1 packet Oral BID BM   pantoprazole  40 mg Oral Daily   zinc sulfate  220 mg Oral Daily   Continuous Infusions:  sodium chloride 100 mL/hr at 12/29/22 0651   sodium chloride     magnesium sulfate bolus IVPB     metronidazole 500 mg (12/29/22 0858)     LOS: 4 days   Hughie Closs, MD  [facility]  12/29/2022, 10:21 AM   *Please note that this is a verbal dictation therefore any spelling or grammatical errors are due to the "Dragon Medical One" system interpretation.  Please page via Amion and do not message via secure chat for urgent patient care matters. Secure chat can be used for non urgent patient care matters.  How to contact the [facility] Attending or Consulting provider 7A - 7P or covering provider during after hours 7P -7A, for this patient?  Check the care team in [facility] and look for a) attending/consulting TRH provider listed and b) the [facility] team listed. Page or secure chat 7A-7P. Log into [email or web address] and use [facility] universal password to access. If you do not have the password, please contact the hospital operator. Locate the [facility] provider you are looking for under [facility] and page to a  number that you can be directly reached. If you still have difficulty reaching the provider, please page the [facility] (Director on Call) for the Hospitalists listed on amion for assistance.

## 2022-12-30 DIAGNOSIS — L089 Local infection of the skin and subcutaneous tissue, unspecified: Secondary | ICD-10-CM | POA: Diagnosis not present

## 2022-12-30 DIAGNOSIS — E11628 Type 2 diabetes mellitus with other skin complications: Secondary | ICD-10-CM | POA: Diagnosis not present

## 2022-12-30 LAB — CBC WITH DIFFERENTIAL/PLATELET
Abs Immature Granulocytes: 0.06 10*3/uL (ref 0.00–0.07)
Basophils Absolute: 0 10*3/uL (ref 0.0–0.1)
Basophils Relative: 0 %
Eosinophils Absolute: 0.1 10*3/uL (ref 0.0–0.5)
Eosinophils Relative: 1 %
HCT: 32 % — ABNORMAL LOW (ref 39.0–52.0)
Hemoglobin: 11 g/dL — ABNORMAL LOW (ref 13.0–17.0)
Immature Granulocytes: 1 %
Lymphocytes Relative: 18 %
Lymphs Abs: 1.7 10*3/uL (ref 0.7–4.0)
MCH: 28.3 pg (ref 26.0–34.0)
MCHC: 34.4 g/dL (ref 30.0–36.0)
MCV: 82.3 fL (ref 80.0–100.0)
Monocytes Absolute: 1.1 10*3/uL — ABNORMAL HIGH (ref 0.1–1.0)
Monocytes Relative: 11 %
Neutro Abs: 6.5 10*3/uL (ref 1.7–7.7)
Neutrophils Relative %: 69 %
Platelets: 255 10*3/uL (ref 150–400)
RBC: 3.89 MIL/uL — ABNORMAL LOW (ref 4.22–5.81)
RDW: 12.9 % (ref 11.5–15.5)
WBC: 9.5 10*3/uL (ref 4.0–10.5)
nRBC: 0 % (ref 0.0–0.2)

## 2022-12-30 LAB — BASIC METABOLIC PANEL
Anion gap: 11 (ref 5–15)
BUN: 17 mg/dL (ref 8–23)
CO2: 24 mmol/L (ref 22–32)
Calcium: 8.5 mg/dL — ABNORMAL LOW (ref 8.9–10.3)
Chloride: 100 mmol/L (ref 98–111)
Creatinine, Ser: 1.07 mg/dL (ref 0.61–1.24)
GFR, Estimated: >60 mL/min (ref >60)
Glucose, Bld: 165 mg/dL — ABNORMAL HIGH (ref 70–99)
Potassium: 3.8 mmol/L (ref 3.5–5.1)
Sodium: 135 mmol/L (ref 135–145)

## 2022-12-30 LAB — CULTURE, BLOOD (ROUTINE X 2)

## 2022-12-30 LAB — GLUCOSE, CAPILLARY
Glucose-Capillary: 166 mg/dL — ABNORMAL HIGH (ref 70–99)
Glucose-Capillary: 172 mg/dL — ABNORMAL HIGH (ref 70–99)

## 2022-12-30 MED ORDER — AMLODIPINE BESYLATE 5 MG PO TABS: 5.0000 mg | ORAL_TABLET | Freq: Every day | ORAL | 0 refills | Status: AC

## 2022-12-30 MED ORDER — INSULIN GLARGINE 100 UNIT/ML SOLOSTAR PEN
15.0000 [IU] | PEN_INJECTOR | Freq: Every day | SUBCUTANEOUS | 0 refills | Status: AC
Start: 2022-12-30 — End: ?

## 2022-12-30 MED ORDER — PEN NEEDLES 31G X 5 MM MISC: 1.0000 | Freq: Three times a day (TID) | 0 refills | Status: AC

## 2022-12-30 MED ORDER — BLOOD GLUCOSE MONITORING SUPPL DEVI: 1.0000 | Freq: Three times a day (TID) | 0 refills | Status: AC

## 2022-12-30 MED ORDER — LANCET DEVICE MISC
1.0000 | Freq: Three times a day (TID) | 0 refills | Status: AC
Start: 2022-12-30 — End: ?

## 2022-12-30 MED ORDER — GVOKE HYPOPEN 2-PACK 0.5 MG/0.1ML ~~LOC~~ SOAJ
0.5000 mg | SUBCUTANEOUS | 0 refills | Status: AC | PRN
Start: 2022-12-30 — End: ?

## 2022-12-30 MED ORDER — LISINOPRIL 40 MG PO TABS: 40.0000 mg | ORAL_TABLET | Freq: Every day | ORAL | 0 refills | Status: AC

## 2022-12-30 MED ORDER — BLOOD GLUCOSE TEST VI STRP
1.0000 | ORAL_STRIP | Freq: Three times a day (TID) | 0 refills | Status: AC
Start: 2022-12-30 — End: ?

## 2022-12-30 MED ORDER — LANCETS MISC
1.0000 | Freq: Three times a day (TID) | 0 refills | Status: AC
Start: 2022-12-30 — End: ?

## 2022-12-30 MED ORDER — HYDROCODONE-ACETAMINOPHEN 5-325 MG PO TABS: 1.0000 | ORAL_TABLET | ORAL | 0 refills | Status: AC | PRN

## 2022-12-30 NOTE — Evaluation (Signed)
Occupational Therapy Evaluation Patient Details Name: Pedro Moyer MRN: [id number] DOB: 1960-09-01 Today's Date: 12/30/2022   History of Present Illness Pt is 62 yo male who presents on 12/23/22 with osteomyelitis of the R great toe, underwent 1st ray amputation on 6/7. PMH: DM2, HTN, OSA.   Clinical Impression   PTA, pt worked for [employer] and was independent. Upon eval, pt performing at supervision to mod I level with exception of tub transfer requiring min guard A. Educated regarding compensatory techniques for LB ADL and tub transfers. Pt limited in distance walked and high fall risk for night time trips to restroom due to weightbearing status and baseline neuropathy; thus recommending BSC for home use. Recommending discharge home with no follow up at this time. OT to sign off; all needs met. Thank you for this order.      Recommendations for follow up therapy are one component of a multi-disciplinary discharge planning process, led by the attending physician.  Recommendations may be updated based on patient status, additional functional criteria and insurance authorization.   Assistance Recommended at Discharge PRN  Patient can return home with the following Help with stairs or ramp for entrance;Assistance with cooking/housework    Functional Status Assessment  Patient has not had a recent decline in their functional status  Equipment Recommendations  BSC/3in1    Recommendations for Other Services       Precautions / Restrictions Precautions Precautions: None Required Braces or Orthoses: Other Brace Other Brace: post op shoe Restrictions Weight Bearing Restrictions: Yes RLE Weight Bearing: Touchdown weight bearing RLE Partial Weight Bearing Percentage or Pounds: through heel only Other Position/Activity Restrictions: wound vac      Mobility Bed Mobility Overal bed mobility: Independent                  Transfers Overall transfer level: Needs  assistance Equipment used: Rolling walker (2 wheels) Transfers: Sit to/from Stand Sit to Stand: Supervision           General transfer comment: supervision approaching mod I      Balance Overall balance assessment: Mild deficits observed, not formally tested                                         ADL either performed or assessed with clinical judgement   ADL Overall ADL's : Needs assistance/impaired Eating/Feeding: Independent   Grooming: Supervision/safety;Standing   Upper Body Bathing: Modified independent   Lower Body Bathing: Supervison/ safety   Upper Body Dressing : Modified independent   Lower Body Dressing: Supervision/safety Lower Body Dressing Details (indicate cue type and reason): donned post-op shoe Toilet Transfer: Supervision/safety;Rolling walker (2 wheels)   Toileting- Clothing Manipulation and Hygiene: Modified independent   Tub/ Shower Transfer: Min guard;Tub transfer;BSC/3in1;Ambulation;Rolling walker (2 wheels) Tub/Shower Transfer Details (indicate cue type and reason): Pt sat ovre edge of tub onto [facility]; had educated to step in. Min guard due to slightly unsafe Functional mobility during ADLs: Supervision/safety;Rolling walker (2 wheels)       Vision Baseline Vision/History: 0 No visual deficits Ability to See in Adequate Light: 0 Adequate Patient Visual Report: No change from baseline Additional Comments: using phone during session with no difficulty     Perception Perception Perception Tested?: No   Praxis Praxis Praxis tested?: Not tested    Pertinent Vitals/Pain Pain Assessment Pain Assessment: No/denies pain     Hand Dominance  Right   Extremity/Trunk Assessment Upper Extremity Assessment Upper Extremity Assessment: Overall WFL for tasks assessed   Lower Extremity Assessment Lower Extremity Assessment: Defer to PT evaluation   Cervical / Trunk Assessment Cervical / Trunk Assessment: Normal   Communication  Communication Communication: No difficulties   Cognition Arousal/Alertness: Awake/alert Behavior During Therapy: WFL for tasks assessed/performed Overall Cognitive Status: Within Functional Limits for tasks assessed                                       General Comments       Exercises     Shoulder Instructions      Home Living Family/patient expects to be discharged to:: Private residence Living Arrangements: Children Available Help at Discharge: Available PRN/intermittently Type of Home: House Home Access: Stairs to enter Secretary/administrator of Steps: 2 Entrance Stairs-Rails: None Home Layout: One level     Bathroom Shower/Tub: Chief Strategy Officer: Standard     Home Equipment: Other (comment) (knee scooter)   Additional Comments: lives with his 2 sons, 72 and 92 yo. 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 Problem List: Decreased strength;Decreased activity tolerance;Impaired balance (sitting and/or standing)      OT Treatment/Interventions:      OT Goals(Current goals can be found in the care plan section) Acute Rehab OT Goals Patient Stated Goal: get back to work OT Goal Formulation: With patient Time For Goal Achievement: 01/13/23 Potential to Achieve Goals: Good  OT Frequency:      Co-evaluation              AM-PAC OT "6 Clicks" Daily Activity     Outcome Measure Help from another person eating meals?: None Help from another person taking care of personal grooming?: A Little Help from another person toileting, which includes using toliet, bedpan, or urinal?: A Little Help from another person bathing (including washing, rinsing, drying)?: A Little Help from another person to put on and taking off regular upper body clothing?:  None Help from another person to put on and taking off regular lower body clothing?: A Little 6 Click Score: 20   End of Session Equipment Utilized During Treatment: Rolling walker (2 wheels) Nurse Communication: Mobility status  Activity Tolerance: Patient tolerated treatment well Patient left: in chair;with call bell/phone within reach  OT Visit Diagnosis: Unsteadiness on feet (R26.81);Muscle weakness (generalized) (M62.81)                Time: 9518-8416 OT Time Calculation (min): 30 min Charges:  OT General Charges $OT Visit: 1 Visit OT Evaluation $OT Eval Low Complexity: 1 Low OT Treatments $Self Care/Home Management : 8-22 mins  Tyler Deis, OTR/L [facility] Acute Rehabilitation Office: [phone]   Myrla Halsted 12/30/2022, 11:01 AM

## 2022-12-30 NOTE — Discharge Summary (Signed)
Physician Discharge Summary  Pedro Moyer WUJ:[id number] DOB: March 14, 1961 DOA: 12/25/2022  PCP: Pcp, No  Admit date: 12/25/2022 Discharge date: 12/30/2022 30 Day Unplanned Readmission Risk Score    Flowsheet Row ED to Hosp-Admission (Current) from 12/25/2022 in [facility]  30 Day Unplanned Readmission Risk Score (%) 12.07 Filed at 12/30/2022 0401       This score is the patient's risk of an unplanned readmission within 30 days of being discharged (0 -100%). The score is based on dignosis, age, lab data, medications, orders, and past utilization.   Low:  0-14.9   Medium: 15-21.9   High: 22-29.9   Extreme: 30 and above          Admitted From: Home Disposition: Home  Recommendations for Outpatient Follow-up:  Follow up with PCP in 1-2 weeks Please obtain BMP/CBC in one week Follow-up with Dr. Lajoyce Corners in 1 week Please follow up with your PCP on the following pending results: Unresulted Labs (From admission, onward)    None         Home Health: None Equipment/Devices: Portable wound VAC  Discharge Condition: Stable  CODE STATUS: Full code Diet recommendation: Heart healthy and diabetic  Subjective: Seen and examined.  Feels well.  No complaints.  Excited to go home.  Discussed in length with him that medically he is stable and I am going to discharge him home pending arrangement for portable wound VAC.  He verbalized understanding.  Brief/Interim Summary: Pedro Moyer is a 62 y.o. male with past medical history significant for type 2 diabetes mellitus, essential hypertension, OSA who presented to [facility] ED on 6/4 with progressive redness, swelling and pain to his right great toe.  Patient reports onset roughly 2 weeks ago as he was walking long distances at [employer] and subsequently developed blisters on his right big toe.  On the last day of his trip, patient popped the blister and put some antibiotic cream overlying this.  Since patient  has been soaking his foot in Epsom salt.  He also went to the ED on Sunday and given a Tdap vaccination and oral Keflex.  Despite antibiotic use his symptoms continued to progress/worsen.  Denies fever/chills.   In regards to his diabetes, currently diet controlled over the last few years.  He reports that he lost 80 pounds and was able to stop his medication.  He is currently not followed by a physician and does not regularly check his blood sugars at home.  Additionally he was recently started on hydrochlorothiazide for his uncontrolled blood pressure after he was seen in the ED as well.   In the ED, temperature 100.9 F.  HR 97, RR 18, BP 188/99, SpO2 100% on room air.  WBC 12.6, hemoglobin 15.5, platelet 254.  Sodium 133, potassium 3.9, chloride 98, CO2 25, glucose 341, BUN 27, creatinine 1.14.  AST 18, ALT 27, total bilirubin 1.4.  CRP 10.6.  Lactic acid 0.5.  Right foot x-ray with soft tissue swelling over the right first toe, no acute bony abnormalities, no radiopaque foreign bodies identified.  Orthopedics was consulted.  He was admitted under hospitalist service for right great toe osteomyelitis.  Details below.   Right great toe osteomyelitis/abscess/cellulitis/Myositis MR right foot with and without contrast with fluid collection plantar aspect distal phalanx of the first digit with surrounding edema and enhancement concerning for abscess and early osteomyelitis; myopathy/myositis.  Seen by orthopedics/Dr. 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  Antibiotics  were discontinued 24 hours postoperatively per Ortho recommendation.  He has wound VAC attached.  Ortho has cleared him for discharge if he gets wound VAC arranged.  I have completed his discharge today.  TOC is working on it.   Uncontrolled type 2 diabetes mellitus with hyperglycemia: Blood glucose elevated 341 on admission.  PTA diet controlled at baseline.  Previously on metformin.  Hemoglobin A1c 9.7.  On 15 units of Semglee and  SSI and blood sugar now better controlled.  He is being discharged on Lantus 15 units.   Essential hypertension Blood pressure was elevated.  He was started on lisinopril, amlodipine.  Resume hydrochlorothiazide.  Blood pressure controlled now.   OSA -- Continue nocturnal CPAP  Discharge plan was discussed with patient and/or family member and they verbalized understanding and agreed with it.  Discharge Diagnoses:  Principal Problem:   Sepsis from diabetic infection of right foot (HCC) Active Problems:   Uncontrolled type 2 diabetes mellitus with hyperglycemia (HCC)   Essential hypertension   Obstructive sleep apnea   Sepsis (HCC)   Osteomyelitis of great toe of right foot (HCC)   Achilles tendon contracture, right    Discharge Instructions  Discharge Instructions     Negative Pressure Wound Therapy - Incisional   Complete by: As directed    Praveena plus portable wound VAC pump for discharge.  Show patient how to plug the unit in to keep it charged.      Allergies as of 12/30/2022       Reactions   Codeine Palpitations        Medication List     STOP taking these medications    cephALEXin 500 MG capsule Commonly known as: KEFLEX       TAKE these medications    amLODipine 5 MG tablet Commonly known as: NORVASC Take 1 tablet (5 mg total) by mouth daily.   Blood Glucose Monitoring Suppl Devi 1 each by Does not apply route 3 (three) times daily. May dispense any manufacturer covered by patient's insurance.   BLOOD GLUCOSE TEST STRIPS Strp 1 each by Does not apply route 3 (three) times daily. Use as directed to check blood sugar. May dispense any manufacturer covered by patient's insurance and fits patient's device.   Gvoke HypoPen 2-Pack 0.5 MG/0.1ML Soaj Generic drug: Glucagon Inject 0.5 mg into the skin as needed for up to 2 doses (Severe low blood sugar).   hydrochlorothiazide 25 MG tablet Commonly known as: HYDRODIURIL Take 1 tablet (25 mg total) by  mouth daily.   HYDROcodone-acetaminophen 5-325 MG tablet Commonly known as: NORCO/VICODIN Take 1 tablet by mouth every 4 (four) hours as needed for moderate pain.   ibuprofen 200 MG tablet Commonly known as: ADVIL Take 600 mg by mouth every 6 (six) hours as needed for moderate pain.   insulin glargine 100 UNIT/ML Solostar Pen Commonly known as: LANTUS Inject 15 Units into the skin daily. May substitute as needed per insurance.   Lancet Device Misc 1 each by Does not apply route 3 (three) times daily. May dispense any manufacturer covered by patient's insurance.   Lancets Misc 1 each by Does not apply route 3 (three) times daily. Use as directed to check blood sugar. May dispense any manufacturer covered by patient's insurance and fits patient's device.   lisinopril 40 MG tablet Commonly known as: ZESTRIL Take 1 tablet (40 mg total) by mouth daily.   Pen Needles 31G X 5 MM Misc 1 each by Does not apply route  3 (three) times daily. May dispense any manufacturer covered by patient's insurance.               Durable Medical Equipment  (From admission, onward)           Start     Ordered   12/29/22 1244  For home use only DME Walker rolling  Once       Question Answer Comment  Walker: With 5 Inch Wheels   Patient needs a walker to treat with the following condition Amputation of toe of right foot [facility])      12/29/22 1244            Follow-up Information     [facility]. Go on 02/20/2023.   Why: You are scheduled for a primary care visit on 02/20/2023 at 0830. Contact information: [address] [place], [place]  [phone]        Nadara Mustard, MD Follow up in 1 week(s).   Specialty: Orthopedic Surgery Contact information: [address] [place] [address] [phone]         PCP Follow up in 1 week(s).                 Allergies  Allergen Reactions   Codeine Palpitations     Consultations: Orthopedics/Dr. Lajoyce Corners   Procedures/Studies: MR FOOT RIGHT W WO CONTRAST  Result Date: 12/26/2022 CLINICAL DATA:  Osteonecrosis suspected EXAM: MRI OF THE RIGHT FOREFOOT WITHOUT AND WITH CONTRAST TECHNIQUE: Multiplanar, multisequence MR imaging of the right forefoot was performed before and after the administration of intravenous contrast. CONTRAST:  10mL GADAVIST GADOBUTROL 1 MMOL/ML IV SOLN COMPARISON:  Radiographs dated December 25, 2022 FINDINGS: Bones/Joint/Cartilage Mild edema of the distal phalanx of the first digit with enhancement on post contrast sequences. Arthritic changes of the first and second metatarsophalangeal joints. Marrow signal within remaining osseous structures is within normal limits. Hallux valgus. Ligaments Lisfranc ligament is intact. Muscles and Tendons Increased intramuscular signal of the plantar muscles suggesting myopathy/myositis. Soft tissues Fluid collection about the plantar aspect of the distal phalanx of the first digit with mild surrounding enhancement suggesting abscess with surrounding edema and inflammatory changes. Generalized soft tissue edema about the dorsum of the foot suggesting cellulitis. IMPRESSION: 1. Fluid collection about the plantar aspect of the distal phalanx of the first digit with surrounding edema and enhancement suggesting abscess. 2. Mild edema of the distal phalanx of the first digit with enhancement on post contrast sequences suggesting early osteomyelitis. 3. Generalized soft tissue edema about the dorsum of the foot suggesting cellulitis. 4. Increased intramuscular signal of the plantar muscles suggesting myopathy/myositis. 5. Hallux valgus with arthritic changes of the first and second metatarsophalangeal joints. Electronically Signed   By: Larose Hires D.O.   On: 12/26/2022 10:14   DG Foot Complete Right  Result Date: 12/25/2022 CLINICAL DATA:  Foot pain. Recently had metal in the foot removed. Worsening swelling of the toe.  EXAM: RIGHT FOOT COMPLETE - 3+ VIEW COMPARISON:  12/23/2022 FINDINGS: Degenerative changes in the first metatarsal-phalangeal joint. 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 No evidence of acute fracture or dislocation. No focal bone lesion or bone destruction. Mild soft tissue swelling suggested over the plantar aspect of the right first toe. No radiopaque foreign bodies or soft tissue gas identified. IMPRESSION: Soft tissue swelling over the right first toe. No radiopaque foreign bodies. No acute bony abnormalities. Electronically Signed   By: Marisa Cyphers.D.  On: 12/25/2022 19:02   DG Foot Complete Right  Result Date: 12/23/2022 CLINICAL DATA:  Recent trauma, pain and swelling EXAM: RIGHT FOOT COMPLETE - 3+ VIEW COMPARISON:  None Available. FINDINGS: No recent fracture or dislocation is seen. There is mild hallux valgus deformity. Tiny bony spurs are seen in first metatarsophalangeal joint. There is soft tissue swelling in the big toe. No focal lytic lesions are seen. There are no radiopaque foreign bodies. IMPRESSION: No fracture or dislocation is seen. There are no opaque foreign bodies. There are no focal lytic lesions. Electronically Signed   By: Ernie Avena M.D.   On: 12/23/2022 15:16     Discharge Exam: Vitals:   12/29/22 2007 12/30/22 0530  BP: (!) 150/87 (!) 150/72  Pulse: 85 79  Resp: 15 15  Temp: 98.3 F (36.8 C) 98.2 F (36.8 C)  SpO2: 100% 99%   Vitals:   12/29/22 1632 12/29/22 1753 12/29/22 2007 12/30/22 0530  BP: (!) 145/83 (!) 152/82 (!) 150/87 (!) 150/72  Pulse: 92 88 85 79  Resp: 18 18 15 15   Temp: 98.2 F (36.8 C) 97.8 F (36.6 C) 98.3 F (36.8 C) 98.2 F (36.8 C)  TempSrc:      SpO2: 98% 100% 100% 99%  Weight:      Height:        General: Pt is alert, awake, not in acute distress Cardiovascular: RRR, S1/S2 +, no rubs, no gallops Respiratory: CTA bilaterally, no wheezing, no rhonchi Abdominal: Soft, NT, ND, bowel sounds  + Extremities: no edema, no cyanosis, wound VAC attached to the right foot.    The results of significant diagnostics from this hospitalization (including imaging, microbiology, ancillary and laboratory) are listed below for reference.     Microbiology: Recent Results (from the past 240 hour(s))  Blood culture (routine x 2)     Status: None   Collection Time: 12/23/22  3:01 PM   Specimen: BLOOD  Result Value Ref Range Status   Specimen Description   Final    BLOOD RIGHT ANTECUBITAL Performed at [facility], [address], [place], [place] [address]    Special Requests   Final    BOTTLES DRAWN AEROBIC AND ANAEROBIC Blood Culture adequate volume Performed at [facility], [address], [place], [place] [address]    Culture   Final    NO GROWTH 5 DAYS Performed at [facility], [address] [place], [place] [address]    Report Status 12/28/2022 FINAL  Final  Blood culture (routine x 2)     Status: None   Collection Time: 12/23/22  3:21 PM   Specimen: BLOOD LEFT FOREARM  Result Value Ref Range Status   Specimen Description   Final    BLOOD LEFT FOREARM Performed at [facility], [address] [place], [place] [address]    Special Requests   Final    BOTTLES DRAWN AEROBIC AND ANAEROBIC Blood Culture results may not be optimal due to an inadequate volume of blood received in culture bottles Performed at [facility], [address] [place], [place] [address]    Culture   Final    NO GROWTH 5 DAYS Performed at [facility], [address], [place], [place] [address]    Report Status 12/28/2022 FINAL  Final  Culture, blood (Routine X 2) w Reflex to ID Panel     Status: None (Preliminary result)   Collection Time: 12/25/22  7:29  PM   Specimen: BLOOD  Result Value Ref Range Status   Specimen Description   Final    BLOOD BLOOD LEFT ARM Performed at [facility], [address] [place], [place] [address]    Special Requests   Final    BOTTLES DRAWN AEROBIC AND ANAEROBIC Blood Culture results may not be optimal due to an excessive volume of blood received in culture bottles Performed at [facility], [address], [place], [place] [address]    Culture   Final    NO GROWTH 4 DAYS Performed at [facility], [address], [place], [place] [address]    Report Status PENDING  Incomplete  Culture, blood (Routine X 2) w Reflex to ID Panel     Status: None (Preliminary result)   Collection Time: 12/25/22  9:22 PM   Specimen: BLOOD  Result Value Ref Range Status   Specimen Description   Final    BLOOD BLOOD LEFT FOREARM Performed at [facility], [address] [place], [place] [address]    Special Requests   Final    BOTTLES DRAWN AEROBIC AND ANAEROBIC Blood Culture adequate volume Performed at [facility], [address], [place], [place] [address]    Culture   Final    NO GROWTH 3 DAYS Performed at [facility], [address], [place], [place] [address]    Report Status PENDING  Incomplete  Surgical pcr screen     Status: None   Collection Time: 12/28/22  9:46 AM   Specimen: Nasal Mucosa; Nasal Swab  Result Value Ref Range Status   MRSA, PCR NEGATIVE NEGATIVE Final   Staphylococcus aureus NEGATIVE NEGATIVE Final    Comment: (NOTE) The Xpert SA Assay (FDA approved for NASAL specimens in patients 37 years of age and older), is one component of a comprehensive surveillance program. It is not intended to diagnose infection nor to guide or monitor treatment. Performed at [facility], [address], [place], [place] [address]      Labs: BNP (last 3 results) No results for input(s): "BNP" in the last 8760 hours. Basic Metabolic Panel: Recent Labs  Lab 12/26/22 0019 12/27/22 0734 12/28/22 1336 12/29/22 0344 12/30/22 0321  NA 132* 135 134* 134* 135  K 3.9 3.5  3.6 4.1 3.8  CL 97* 101 102 103 100  CO2 24 23 24 23 24   GLUCOSE 310* 201* 170* 165* 165*  BUN 21 17 12 15 17   CREATININE 1.08 1.12 0.99 1.06 1.07  CALCIUM 8.5* 8.4* 8.2* 8.3* 8.5*   Liver Function Tests: Recent Labs  Lab 12/23/22 1236 12/25/22 1929 12/27/22 0734  AST 20 18 25   ALT 29 27 30   ALKPHOS 79 89 91  BILITOT 2.9* 1.4* 1.4*  PROT 7.9 8.3* 7.2  ALBUMIN 4.1 4.0 3.3*   No results for input(s): "LIPASE", "AMYLASE" in the last 168 hours. No results for input(s): "AMMONIA" in the last 168 hours. CBC: Recent Labs  Lab 12/23/22 1236 12/25/22 1929 12/26/22 0019 12/27/22 0734 12/29/22 0344 12/30/22 0321  WBC 12.3* 12.6* 11.5* 12.2* 10.3 9.5  NEUTROABS 9.2* 10.3*  --   --  6.9 6.5  HGB 15.9 15.5 14.3 15.0 11.7* 11.0*  HCT 46.0 44.5 40.7 42.9 34.5* 32.0*  MCV 83.6 82.1 82.9 83.6 82.9 82.3  PLT 214 254 240 286 256 255   Cardiac Enzymes: No results for input(s): "CKTOTAL", "CKMB", "CKMBINDEX", "TROPONINI" in the last  168 hours. BNP: Invalid input(s): "POCBNP" CBG: Recent Labs  Lab 12/28/22 1937 12/29/22 0742 12/29/22 1241 12/29/22 1754 12/29/22 2005  GLUCAP 140* 151* 220* 243* 172*   D-Dimer No results for input(s): "DDIMER" in the last 72 hours. Hgb A1c No results for input(s): "HGBA1C" in the last 72 hours. Lipid Profile No results for input(s): "CHOL", "HDL", "LDLCALC", "TRIG", "CHOLHDL", "LDLDIRECT" in the last 72 hours. Thyroid function studies No results for input(s): "TSH", "T4TOTAL", "T3FREE", "THYROIDAB" in the last 72 hours.  Invalid input(s): "FREET3" Anemia work up No results for input(s): "VITAMINB12", "FOLATE", "FERRITIN", "TIBC", "IRON", "RETICCTPCT" in the last 72 hours. Urinalysis    Component Value Date/Time   COLORURINE YELLOW 12/23/2022 1520   APPEARANCEUR CLEAR 12/23/2022 1520   LABSPEC 1.031 (H) 12/23/2022 1520   PHURINE 6.0 12/23/2022 1520   GLUCOSEU >=500 (A) 12/23/2022 1520   HGBUR NEGATIVE 12/23/2022 1520   BILIRUBINUR  NEGATIVE 12/23/2022 1520   BILIRUBINUR n 05/09/2016 1016   KETONESUR NEGATIVE 12/23/2022 1520   PROTEINUR 100 (A) 12/23/2022 1520   UROBILINOGEN 1.0 05/09/2016 1016   NITRITE NEGATIVE 12/23/2022 1520   LEUKOCYTESUR NEGATIVE 12/23/2022 1520   Sepsis Labs Recent Labs  Lab 12/26/22 0019 12/27/22 0734 12/29/22 0344 12/30/22 0321  WBC 11.5* 12.2* 10.3 9.5   Microbiology Recent Results (from the past 240 hour(s))  Blood culture (routine x 2)     Status: None   Collection Time: 12/23/22  3:01 PM   Specimen: BLOOD  Result Value Ref Range Status   Specimen Description   Final    BLOOD RIGHT ANTECUBITAL Performed at [facility], [address], [place], [place] [address]    Special Requests   Final    BOTTLES DRAWN AEROBIC AND ANAEROBIC Blood Culture adequate volume Performed at [facility], [address] [place], [place] [address]    Culture   Final    NO GROWTH 5 DAYS Performed at [facility], [address], [place], [place] [address]    Report Status 12/28/2022 FINAL  Final  Blood culture (routine x 2)     Status: None   Collection Time: 12/23/22  3:21 PM   Specimen: BLOOD LEFT FOREARM  Result Value Ref Range Status   Specimen Description   Final    BLOOD LEFT FOREARM Performed at [facility], [address], [place], [place] [address]    Special Requests   Final    BOTTLES DRAWN AEROBIC AND ANAEROBIC Blood Culture results may not be optimal due to an inadequate volume of blood received in culture bottles Performed at [facility], [address], [place], [place] [address]    Culture   Final    NO GROWTH 5 DAYS Performed at [facility], [address], [place], [place] [address]    Report Status 12/28/2022 FINAL  Final  Culture, blood (Routine X 2) w Reflex to ID Panel     Status: None (Preliminary result)   Collection Time: 12/25/22  7:29 PM   Specimen: BLOOD  Result Value Ref Range  Status   Specimen Description   Final    BLOOD BLOOD LEFT ARM Performed at [facility], [address], [place], [place] [address]    Special Requests   Final    BOTTLES DRAWN AEROBIC AND ANAEROBIC Blood Culture results may not be optimal due to an excessive volume of blood received in culture bottles Performed at [facility],  [address] [place], [place] [address]    Culture   Final    NO GROWTH 4 DAYS Performed at [facility], [address], [place], [place] [address]    Report Status PENDING  Incomplete  Culture, blood (Routine X 2) w Reflex to ID Panel     Status: None (Preliminary result)   Collection Time: 12/25/22  9:22 PM   Specimen: BLOOD  Result Value Ref Range Status   Specimen Description   Final    BLOOD BLOOD LEFT FOREARM Performed at [facility], [address] [place], [place] [address]    Special Requests   Final    BOTTLES DRAWN AEROBIC AND ANAEROBIC Blood Culture adequate volume Performed at [facility], [address], [place], [place] [address]    Culture   Final    NO GROWTH 3 DAYS Performed at [facility], [address] [place], [place] [address]    Report Status PENDING  Incomplete  Surgical pcr screen     Status: None   Collection Time: 12/28/22  9:46 AM   Specimen: Nasal Mucosa; Nasal Swab  Result Value Ref Range Status   MRSA, PCR NEGATIVE NEGATIVE Final   Staphylococcus aureus NEGATIVE NEGATIVE Final    Comment: (NOTE) The Xpert SA Assay (FDA approved for NASAL specimens in patients 75 years of age and older), is one component of a comprehensive surveillance program. It is not intended to diagnose infection nor to guide or monitor treatment. Performed at [facility], [address], [place], [place] [address]      Time coordinating discharge: Over 30 minutes  SIGNED:   Hughie Closs, MD  [facility] 12/30/2022, 7:38  AM *Please note that this is a verbal dictation therefore any spelling or grammatical errors are due to the "Dragon [employer] One" system interpretation. If 7PM-7AM, please contact night-coverage [email or web address]

## 2022-12-30 NOTE — Plan of Care (Signed)
  Problem: Education: Goal: Ability to describe self-care measures that may prevent or decrease complications (Diabetes Survival Skills Education) will improve Outcome: Adequate for Discharge Goal: Individualized Educational Video(s) Outcome: Adequate for Discharge   Problem: Coping: Goal: Ability to adjust to condition or change in health will improve Outcome: Adequate for Discharge   Problem: Fluid Volume: Goal: Ability to maintain a balanced intake and output will improve Outcome: Adequate for Discharge   Problem: Health Behavior/Discharge Planning: Goal: Ability to identify and utilize available resources and services will improve Outcome: Adequate for Discharge Goal: Ability to manage health-related needs will improve Outcome: Adequate for Discharge   Problem: Metabolic: Goal: Ability to maintain appropriate glucose levels will improve Outcome: Adequate for Discharge   Problem: Nutritional: Goal: Maintenance of adequate nutrition will improve Outcome: Adequate for Discharge Goal: Progress toward achieving an optimal weight will improve Outcome: Adequate for Discharge   Problem: Skin Integrity: Goal: Risk for impaired skin integrity will decrease Outcome: Adequate for Discharge   Problem: Tissue Perfusion: Goal: Adequacy of tissue perfusion will improve Outcome: Adequate for Discharge   Problem: Education: Goal: Knowledge of General Education information will improve Description: Including pain rating scale, medication(s)/side effects and non-pharmacologic comfort measures Outcome: Adequate for Discharge   Problem: Health Behavior/Discharge Planning: Goal: Ability to manage health-related needs will improve Outcome: Adequate for Discharge   Problem: Clinical Measurements: Goal: Ability to maintain clinical measurements within normal limits will improve Outcome: Adequate for Discharge Goal: Will remain free from infection Outcome: Adequate for Discharge Goal:  Diagnostic test results will improve Outcome: Adequate for Discharge Goal: Respiratory complications will improve Outcome: Adequate for Discharge Goal: Cardiovascular complication will be avoided Outcome: Adequate for Discharge   Problem: Activity: Goal: Risk for activity intolerance will decrease Outcome: Adequate for Discharge   Problem: Nutrition: Goal: Adequate nutrition will be maintained Outcome: Adequate for Discharge   Problem: Coping: Goal: Level of anxiety will decrease Outcome: Adequate for Discharge   Problem: Elimination: Goal: Will not experience complications related to bowel motility Outcome: Adequate for Discharge Goal: Will not experience complications related to urinary retention Outcome: Adequate for Discharge   Problem: Pain Managment: Goal: General experience of comfort will improve Outcome: Adequate for Discharge   Problem: Safety: Goal: Ability to remain free from injury will improve Outcome: Adequate for Discharge   Problem: Skin Integrity: Goal: Risk for impaired skin integrity will decrease Outcome: Adequate for Discharge   Problem: Education: Goal: Knowledge of the prescribed therapeutic regimen will improve Outcome: Adequate for Discharge Goal: Ability to verbalize activity precautions or restrictions will improve Outcome: Adequate for Discharge Goal: Understanding of discharge needs will improve Outcome: Adequate for Discharge   Problem: Activity: Goal: Ability to perform//tolerate increased activity and mobilize with assistive devices will improve Outcome: Adequate for Discharge   Problem: Clinical Measurements: Goal: Postoperative complications will be avoided or minimized Outcome: Adequate for Discharge   Problem: Self-Care: Goal: Ability to meet self-care needs will improve Outcome: Adequate for Discharge   Problem: Self-Concept: Goal: Ability to maintain and perform role responsibilities to the fullest extent possible will  improve Outcome: Adequate for Discharge   Problem: Pain Management: Goal: Pain level will decrease with appropriate interventions Outcome: Adequate for Discharge   Problem: Skin Integrity: Goal: Demonstration of wound healing without infection will improve Outcome: Adequate for Discharge   

## 2022-12-30 NOTE — Progress Notes (Signed)
Pt self adminiisters his home cpap. Pt said will put cpap on when ready.

## 2022-12-30 NOTE — TOC Transition Note (Addendum)
Transition of Care [facility]) - CM/SW Discharge Note   Patient Details  Name: Pedro Moyer MRN: [id number] Date of Birth: [birth year]  Transition of [facility] CM/SW Contact:  Ronny Bacon, RN Phone Number: 12/30/2022, 8:27 AM   Clinical Narrative:   Confirmed with bedside nurse that patient has rolling walker for home use at bedside. Patient discharged with Prevena wound vac, no HH needs. Patient family will transport patient home.    Final next level of care: Home/Self Care Barriers to Discharge: No Barriers Identified   Patient Goals and CMS Choice CMS Medicare.gov Compare Post Acute Care list provided to:: Patient Choice offered to / list presented to : Patient  Discharge Placement                         Discharge Plan and Services Additional resources added to the After Visit Summary for     Discharge Planning Services: CM Consult            DME Arranged: Dan Humphreys rolling DME Agency: [employer] Date DME Agency Contacted: 12/29/22 Time DME Agency Contacted: 1453 Representative spoke with at DME Agency: Vaughan Basta            Social Determinants of Health (SDOH) Interventions SDOH Screenings   Food Insecurity: No Food Insecurity (12/26/2022)  Housing: Patient Declined (12/26/2022)  Transportation Needs: No Transportation Needs (12/26/2022)  Utilities: Not At Risk (12/26/2022)  Tobacco Use: Low Risk  (12/29/2022)     Readmission Risk Interventions    12/26/2022   11:03 AM  Readmission Risk Prevention Plan  Post Dischage Appt Complete  Medication Screening Complete  Transportation Screening Complete

## 2022-12-30 NOTE — Progress Notes (Addendum)
PT Cancellation Note  Patient Details Name: Pedro Moyer MRN: [id number] DOB: 1961/05/25   Cancelled Treatment:    Reason Eval/Treat Not Completed: Other (comment)  Pt walking in room independently with post-op shoe and TDWB status without difficulty.  Declined further PT needs. Will be staying with family at d/c - had d/c orders for today.  Will sign off acute PT.  Anise Salvo, PT Acute Rehab [facility] [phone]  Rayetta Humphrey 12/30/2022, 9:51 AM

## 2023-01-01 ENCOUNTER — Telehealth: Payer: Self-pay | Admitting: Orthopedic Surgery

## 2023-01-01 NOTE — Telephone Encounter (Signed)
Patient called. Would like to  know if he is suppose to take any antibiotics?  His [phone]

## 2023-01-01 NOTE — Telephone Encounter (Signed)
Called pt, bad connection. Called again, went straight to VM, LMTCB

## 2023-01-01 NOTE — Telephone Encounter (Signed)
Was assisting other patients at the time of call back. I did just call him back, rang a few times, then silent. Will try again later. Last telephone message was closed out? He had called asking if he needed anymore antibiotics post op.  Dr. Lajoyce Corners has taken all infection out of foot during surgery and he was also on pre op IV antibiotics, so no need for additional antibiotic treatment at this time. We will eval at his next appt in office.

## 2023-01-01 NOTE — Telephone Encounter (Signed)
Patient called. Returning a call to [employer]

## 2023-01-01 NOTE — Telephone Encounter (Signed)
Patient Advocate Encounter  Received notification that the request for prior authorization for Dexcom G7 Sensor  has been denied.       Alesha Jaffee, CPhT Pharmacy Patient Advocate Specialist [facility] Patient Advocate Team Direct Number: [phone]  Fax: [phone] 

## 2023-01-03 ENCOUNTER — Telehealth: Payer: Self-pay | Admitting: Orthopedic Surgery

## 2023-01-03 ENCOUNTER — Ambulatory Visit (INDEPENDENT_AMBULATORY_CARE_PROVIDER_SITE_OTHER): Payer: BLUE CROSS/BLUE SHIELD | Admitting: Orthopedic Surgery

## 2023-01-03 ENCOUNTER — Telehealth: Payer: Self-pay | Admitting: Podiatry

## 2023-01-03 ENCOUNTER — Encounter: Payer: Self-pay | Admitting: Orthopedic Surgery

## 2023-01-03 DIAGNOSIS — Z89412 Acquired absence of left great toe: Secondary | ICD-10-CM

## 2023-01-03 DIAGNOSIS — S98111A Complete traumatic amputation of right great toe, initial encounter: Secondary | ICD-10-CM

## 2023-01-03 NOTE — Telephone Encounter (Signed)
SW pt and his dtr. Advised again to wash with dial soap and water, pat dry, put gauze in top and wrap with ACE bandage. I let them know that [facility] is not approved for just dry dressing changes as this is not a skilled need. I will put in order for nursing to do patient education on wound care and possibly can go out once a week for incision checks.

## 2023-01-03 NOTE — Telephone Encounter (Signed)
SW pt's dtr this morning. He is scheduled to see Dr. Lajoyce Corners this morning

## 2023-01-03 NOTE — Telephone Encounter (Signed)
Pt needs clarification for wound care on foot and they would like a order for [facility] Care Nurse for his wound care, patient stated he talked with his insurance Herbalist) about it and that is what they advised is the Dr put in a order, please advise

## 2023-01-03 NOTE — Telephone Encounter (Signed)
Patient has an appointment on Monday with Dr. Annamary Rummage (New patient).  He called, noting he had a hallux amputation performed last Friday by Dr. Lajoyce Corners and the wound has "opened up some", and he wants advice on what to do.  ------  Called patient back to discuss.  Confirmed that patient is not yet an established patient at our office and our doctors at [facility] have never examined him yet.  From a legal standpoint, we cannot offer any medical advice over the phone until he becomes established.  Advised him to call Dr. Lajoyce Corners, or anyone from that office, for advice until he has been seen here and we can take over his care.  He didn't state that he wouldn't call their office, but began talking about how the wound vac was removed and he was given a heel wedge/surgical shoe, which "turns out was the wrong thing to give me, because now the area is opening up in a couple of places".  He has been elevating the foot.  Instructed him if anything is "open" recently after surgery, he should cover with sterile gauze and wrap, elevate foot and stay off of it as much as possible, but that we cannot give any more specific advice related to his condition since no provider here has actually seen the foot or knows the specifics of his condition/surgery.  Once again advised him to call Dr. Lajoyce Corners for additional instructions.

## 2023-01-03 NOTE — Progress Notes (Signed)
Office Visit Note   Patient: Pedro Moyer           Date of Birth: Nov 29, 1960           MRN: [id number] Visit Date: 01/03/2023              Requested by: No referring provider defined for this encounter. PCP: Pcp, No  Chief Complaint  Patient presents with   Right Foot - Routine Post Op    12/28/22 right 1st ray amputation      HPI: Patient is a 62 year old gentleman who is 1 week status post right foot first ray amputation.  Assessment & Plan: Visit Diagnoses:  1. 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  Recommended a kneeling scooter.  Recommended getting on the cancellation list with his medical doctor.  Patient is currently on blood pressure medication as well as insulin and still has a high blood pressure and high glucose.  Crutches were provided to help offload his foot.  Recommended a kneeling scooter.  Follow-Up Instructions: Return in about 1 week (around 01/10/2023).   Ortho Exam  Patient is alert, oriented, no adenopathy, well-dressed, normal affect, normal respiratory effort. Examination the patient's wound is macerated there is swelling and slight wound dehiscence from too much pressure.  Patient's blood sugars are still running over 200 and still has elevated blood pressure  Imaging: No results found. No images are attached to the encounter.  Labs: Lab Results  Component Value Date   HGBA1C 9.7 (H) 12/26/2022   HGBA1C 10.0 (H) 12/25/2022   HGBA1C 5.9% 09/21/2016   ESRSEDRATE 39 (H) 12/25/2022   CRP 10.6 (H) 12/25/2022   LABURIC 4.3 12/26/2022   REPTSTATUS 12/31/2022 FINAL 12/25/2022   CULT  12/25/2022    NO GROWTH 5 DAYS Performed at [facility], [address], [place], [place] [address]      Lab Results  Component Value Date   ALBUMIN 3.3 (L) 12/27/2022   ALBUMIN 4.0 12/25/2022   ALBUMIN 4.1 12/23/2022    No results found for: "MG" No results found for:  "VD25OH"  No results found for: "PREALBUMIN"    Latest Ref Rng & Units 12/30/2022    3:21 AM 12/29/2022    3:44 AM 12/27/2022    7:34 AM  CBC EXTENDED  WBC 4.0 - 10.5 K/uL 9.5  10.3  12.2   RBC 4.22 - 5.81 MIL/uL 3.89  4.16  5.13   Hemoglobin 13.0 - 17.0 g/dL 19.1  47.8  29.5   HCT 39.0 - 52.0 % 32.0  34.5  42.9   Platelets 150 - 400 K/uL 255  256  286   NEUT# 1.7 - 7.7 K/uL 6.5  6.9    Lymph# 0.7 - 4.0 K/uL 1.7  1.9       There is no height or weight on file to calculate BMI.  Orders:  No orders of the defined types were placed in this encounter.  No orders of the defined types were placed in this encounter.    Procedures: No procedures performed  Clinical Data: No additional findings.  ROS:  All other systems negative, except as noted in the HPI. Review of Systems  Objective: Vital Signs: There were no vitals taken for this visit.  Specialty Comments:  No specialty comments available.  PMFS History: Patient Active Problem List   Diagnosis Date Noted   Osteomyelitis of great toe of right foot (  HCC) 12/26/2022   Achilles tendon contracture, right 12/26/2022   Sepsis from diabetic infection of right foot (HCC) 12/25/2022   Sepsis (HCC) 12/25/2022   Essential hypertension 12/25/2022   Uncontrolled type 2 diabetes mellitus with hyperglycemia (HCC) 05/09/2016   Dyspnea 04/18/2016   Obstructive sleep apnea 03/19/2014   Past Medical History:  Diagnosis Date   Borderline hypertension    Diabetes (HCC)    diagnosed 2017   Diabetes mellitus type 2, controlled, without complications (HCC) 05/09/2016   Environmental allergies    Hypertension    PONV (postoperative nausea and vomiting)    Sleep apnea     Family History  Problem Relation Age of Onset   Lung cancer Mother        died at 8   Diabetes Mother    Other Father        blood disease-ETOH abuse   Cirrhosis Father    Diabetes Brother    Diabetes Brother    Diabetes Brother     Past Surgical History:   Procedure Laterality Date   AMPUTATION Right 12/28/2022   Procedure: RIGHT 1ST RAY AMPUTATION;  Surgeon: Nadara Mustard, MD;  Location: [facility] OR;  Service: Orthopedics;  Laterality: Right;   SHOULDER SURGERY  1981   WISDOM TOOTH EXTRACTION     Social History   Occupational History   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

## 2023-01-04 ENCOUNTER — Encounter: Payer: BLUE CROSS/BLUE SHIELD | Admitting: Family

## 2023-01-07 ENCOUNTER — Ambulatory Visit: Payer: BLUE CROSS/BLUE SHIELD | Admitting: Podiatry

## 2023-01-07 ENCOUNTER — Telehealth: Payer: Self-pay | Admitting: Orthopedic Surgery

## 2023-01-07 DIAGNOSIS — Z9889 Other specified postprocedural states: Secondary | ICD-10-CM | POA: Diagnosis not present

## 2023-01-07 DIAGNOSIS — E1142 Type 2 diabetes mellitus with diabetic polyneuropathy: Secondary | ICD-10-CM | POA: Diagnosis not present

## 2023-01-07 DIAGNOSIS — Z89419 Acquired absence of unspecified great toe: Secondary | ICD-10-CM | POA: Diagnosis not present

## 2023-01-07 NOTE — Telephone Encounter (Signed)
Patsy with [facility] called regarding a referral that they received for Mr. Pedro Moyer.  She advised that we should try another home health agency because they are out of network with the patient's insurance.  Patsy's call back # is [phone] if there are questions.

## 2023-01-07 NOTE — Progress Notes (Signed)
  Subjective:  Patient ID: Pedro Moyer, male    DOB: 12-01-1960,  MRN: [id number]  Chief Complaint  Patient presents with   Foot Problem    right great toe infection - diabetic    63 y.o. male presents with concern for possible amputation site issue at site of prior partial first ray amputation done by Dr. Lajoyce Corners approximately on December 28, 2022.  He followed up with Dr. Lajoyce Corners last week.  Has also had a second opinion on the amputation site done at [facility].  They told him that everything was looking good at the amputation site and to continue with current care. Interested in another opinion in regards to 2 small areas of drainage along the amputation site however reports that is seem to be improving.  Decreased swelling in the foot.  He is not currently on antibiotics  Past Medical History:  Diagnosis Date   Borderline hypertension    Diabetes (HCC)    diagnosed 2017   Diabetes mellitus type 2, controlled, without complications (HCC) 05/09/2016   Environmental allergies    Hypertension    PONV (postoperative nausea and vomiting)    Sleep apnea     Allergies  Allergen Reactions   Codeine Palpitations    ROS: Negative except as per HPI above  Objective:  General: AAO x3, NAD  Dermatological: See amputation site picture below along the first ray no evidence of dehiscence.  Mild incisional necrosis however no diffuse necrosis or erythema/maceration/drainage concerning for residual infection overall well-healing amputation site  Vascular:  Dorsalis Pedis artery and Posterior Tibial artery pedal pulses are 2/4 bilateral.  Capillary fill time < 3 sec to all digits.   Neruologic: Grossly absent to light touch  Musculoskeletal: Status post partial first ray amputation  Gait: Unassisted, Nonantalgic.      Assessment:   1. History of amputation of great toe (HCC)   2. Post-operative state   3. DM type 2 with diabetic peripheral neuropathy (HCC)      Plan:  Patient was evaluated  and treated and all questions answered.  # Status post right partial first ray amputation -Continue postoperative care as per Dr. Lajoyce Corners -Discussed that I do not see any evidence of issue at the amputation site appears to be healing well there is mild incisional necrosis however I believe that this will resolve with time.  No evidence of residual infection at this time no indication for antibiotics -If patient desires would recommend Betadine versus soapy water at the amputation site for drying and antibacterial properties -Continue dressing until the incision is fully healed -Recommend use of cam boot or heel walker shoe to offload the amputation site -Continue with strict edema control with elevation of the extremity when seated  Return if symptoms worsen or fail to improve.          Corinna Gab, DPM [facility] / [facility]

## 2023-01-07 NOTE — Telephone Encounter (Signed)
Faxed HH referral to [facility]

## 2023-01-07 NOTE — Telephone Encounter (Signed)
Noted. I have sent new referral to [facility].

## 2023-01-08 NOTE — Telephone Encounter (Signed)
Faxed to [facility]. This will be 3rd referral placed. Also this is not a skilled nursing need, I told pt this last week. I told them I can see what I can do about wound management and incisional checks weekly by nurse. They would not be able to come out and wrap his leg daily.

## 2023-01-08 NOTE — Telephone Encounter (Signed)
SW [place] with [facility] and they do not take pt's insurance. Out of network plan.

## 2023-01-10 ENCOUNTER — Ambulatory Visit (INDEPENDENT_AMBULATORY_CARE_PROVIDER_SITE_OTHER): Payer: BLUE CROSS/BLUE SHIELD | Admitting: Orthopedic Surgery

## 2023-01-10 ENCOUNTER — Encounter: Payer: Self-pay | Admitting: Orthopedic Surgery

## 2023-01-10 DIAGNOSIS — S98111A Complete traumatic amputation of right great toe, initial encounter: Secondary | ICD-10-CM

## 2023-01-10 DIAGNOSIS — Z89411 Acquired absence of right great toe: Secondary | ICD-10-CM

## 2023-01-10 NOTE — Progress Notes (Signed)
Office Visit Note   Patient: Pedro Moyer           Date of Birth: [birth year]           MRN: [id number] Visit Date: 01/10/2023              Requested by: No referring provider defined for this encounter. PCP: Pcp, No  Chief Complaint  Patient presents with   Right Foot - Routine Post Op     12/28/22 right 1st ray amputation        HPI: Patient is a 62 year old gentleman who is 2 weeks status post right foot first ray amputation.  Assessment & Plan: Visit Diagnoses:  1. 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  Continue to work on blood pressure and glucose control.  Harvest sutures at follow-up.  Follow-Up Instructions: Return in about 1 week (around 01/17/2023).   Ortho Exam  Patient is alert, oriented, no adenopathy, well-dressed, normal affect, normal respiratory effort. Examination the wound edges are well-approximated there is some mild ischemic changes secondary to swelling.  No cellulitis no signs of infection.  Patient states his blood pressure systolic is down to approximately 150 with his glucose down to the high 100s.  Imaging: No results found. No images are attached to the encounter.  Labs: Lab Results  Component Value Date   HGBA1C 9.7 (H) 12/26/2022   HGBA1C 10.0 (H) 12/25/2022   HGBA1C 5.9% 09/21/2016   ESRSEDRATE 39 (H) 12/25/2022   CRP 10.6 (H) 12/25/2022   LABURIC 4.3 12/26/2022   REPTSTATUS 12/31/2022 FINAL 12/25/2022   CULT  12/25/2022    NO GROWTH 5 DAYS Performed at [facility], [address], [place], [place] [address]      Lab Results  Component Value Date   ALBUMIN 3.3 (L) 12/27/2022   ALBUMIN 4.0 12/25/2022   ALBUMIN 4.1 12/23/2022    No results found for: "MG" No results found for: "VD25OH"  No results found for: "PREALBUMIN"    Latest Ref Rng & Units 12/30/2022    3:21 AM 12/29/2022     3:44 AM 12/27/2022    7:34 AM  CBC EXTENDED  WBC 4.0 - 10.5 K/uL 9.5  10.3  12.2   RBC 4.22 - 5.81 MIL/uL 3.89  4.16  5.13   Hemoglobin 13.0 - 17.0 g/dL 19.1  47.8  29.5   HCT 39.0 - 52.0 % 32.0  34.5  42.9   Platelets 150 - 400 K/uL 255  256  286   NEUT# 1.7 - 7.7 K/uL 6.5  6.9    Lymph# 0.7 - 4.0 K/uL 1.7  1.9       There is no height or weight on file to calculate BMI.  Orders:  No orders of the defined types were placed in this encounter.  No orders of the defined types were placed in this encounter.    Procedures: No procedures performed  Clinical Data: No additional findings.  ROS:  All other systems negative, except as noted in the HPI. Review of Systems  Objective: Vital Signs: There were no vitals taken for this visit.  Specialty Comments:  No specialty comments available.  PMFS History: Patient Active Problem List   Diagnosis Date Noted   Osteomyelitis of great toe of right foot (HCC) 12/26/2022   Achilles tendon contracture, right 12/26/2022  Sepsis from diabetic infection of right foot (HCC) 12/25/2022   Sepsis (HCC) 12/25/2022   Essential hypertension 12/25/2022   Uncontrolled type 2 diabetes mellitus with hyperglycemia (HCC) 05/09/2016   Dyspnea 04/18/2016   Obstructive sleep apnea 03/19/2014   Past Medical History:  Diagnosis Date   Borderline hypertension    Diabetes (HCC)    diagnosed 2017   Diabetes mellitus type 2, controlled, without complications (HCC) 05/09/2016   Environmental allergies    Hypertension    PONV (postoperative nausea and vomiting)    Sleep apnea     Family History  Problem Relation Age of Onset   Lung cancer Mother        died at 52   Diabetes Mother    Other Father        blood disease-ETOH abuse   Cirrhosis Father    Diabetes Brother    Diabetes Brother    Diabetes Brother     Past Surgical History:  Procedure Laterality Date   AMPUTATION Right 12/28/2022   Procedure: RIGHT 1ST RAY AMPUTATION;   Surgeon: Nadara Mustard, MD;  Location: [facility] OR;  Service: Orthopedics;  Laterality: Right;   SHOULDER SURGERY  1981   WISDOM TOOTH EXTRACTION     Social History   Occupational History   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

## 2023-01-17 ENCOUNTER — Ambulatory Visit (INDEPENDENT_AMBULATORY_CARE_PROVIDER_SITE_OTHER): Payer: BLUE CROSS/BLUE SHIELD | Admitting: Orthopedic Surgery

## 2023-01-17 ENCOUNTER — Encounter: Payer: Self-pay | Admitting: Orthopedic Surgery

## 2023-01-17 DIAGNOSIS — S98111A Complete traumatic amputation of right great toe, initial encounter: Secondary | ICD-10-CM

## 2023-01-17 DIAGNOSIS — Z89411 Acquired absence of right great toe: Secondary | ICD-10-CM

## 2023-01-17 NOTE — Progress Notes (Signed)
Office Visit Note   Patient: Pedro Moyer           Date of Birth: 1961/02/26           MRN: [id number] Visit Date: 01/17/2023              Requested by: No referring provider defined for this encounter. PCP: Pcp, No  Chief Complaint  Patient presents with   Right Foot - Routine Post Op    12/28/2022 right 1st ray amputation       HPI: Patient is a 62 year old gentleman who is 2 weeks status post right foot first ray amputation.  Patient is currently on a kneeling scooter.  Assessment & Plan: Visit Diagnoses:  1. 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  Follow-up in 2 weeks.  Discussed with too much weightbearing the wound will dehisce.  Follow-Up Instructions: Return in about 2 weeks (around 01/31/2023).   Ortho Exam  Patient is alert, oriented, no adenopathy, well-dressed, normal affect, normal respiratory effort. Examination the incision is well-approximated there is no cellulitis no drainage no signs of infection.  Sutures harvested today.  Imaging: No results found. No images are attached to the encounter.  Labs: Lab Results  Component Value Date   HGBA1C 9.7 (H) 12/26/2022   HGBA1C 10.0 (H) 12/25/2022   HGBA1C 5.9% 09/21/2016   ESRSEDRATE 39 (H) 12/25/2022   CRP 10.6 (H) 12/25/2022   LABURIC 4.3 12/26/2022   REPTSTATUS 12/31/2022 FINAL 12/25/2022   CULT  12/25/2022    NO GROWTH 5 DAYS Performed at [facility], [address], [place], [place] [address]      Lab Results  Component Value Date   ALBUMIN 3.3 (L) 12/27/2022   ALBUMIN 4.0 12/25/2022   ALBUMIN 4.1 12/23/2022    No results found for: "MG" No results found for: "VD25OH"  No results found for: "PREALBUMIN"    Latest Ref Rng & Units 12/30/2022    3:21 AM 12/29/2022    3:44 AM 12/27/2022    7:34 AM  CBC EXTENDED  WBC 4.0 - 10.5 K/uL 9.5  10.3  12.2   RBC 4.22 - 5.81 MIL/uL 3.89  4.16  5.13    Hemoglobin 13.0 - 17.0 g/dL 19.1  47.8  29.5   HCT 39.0 - 52.0 % 32.0  34.5  42.9   Platelets 150 - 400 K/uL 255  256  286   NEUT# 1.7 - 7.7 K/uL 6.5  6.9    Lymph# 0.7 - 4.0 K/uL 1.7  1.9       There is no height or weight on file to calculate BMI.  Orders:  No orders of the defined types were placed in this encounter.  No orders of the defined types were placed in this encounter.    Procedures: No procedures performed  Clinical Data: No additional findings.  ROS:  All other systems negative, except as noted in the HPI. Review of Systems  Objective: Vital Signs: There were no vitals taken for this visit.  Specialty Comments:  No specialty comments available.  PMFS History: Patient Active Problem List   Diagnosis Date Noted   Osteomyelitis of great toe of right foot (HCC) 12/26/2022   Achilles tendon contracture, right 12/26/2022   Sepsis from diabetic infection of right foot (HCC) 12/25/2022   Sepsis (HCC) 12/25/2022   Essential hypertension 12/25/2022   Uncontrolled type 2 diabetes mellitus with  hyperglycemia (HCC) 05/09/2016   Dyspnea 04/18/2016   Obstructive sleep apnea 03/19/2014   Past Medical History:  Diagnosis Date   Borderline hypertension    Diabetes (HCC)    diagnosed 2017   Diabetes mellitus type 2, controlled, without complications (HCC) 05/09/2016   Environmental allergies    Hypertension    PONV (postoperative nausea and vomiting)    Sleep apnea     Family History  Problem Relation Age of Onset   Lung cancer Mother        died at 49   Diabetes Mother    Other Father        blood disease-ETOH abuse   Cirrhosis Father    Diabetes Brother    Diabetes Brother    Diabetes Brother     Past Surgical History:  Procedure Laterality Date   AMPUTATION Right 12/28/2022   Procedure: RIGHT 1ST RAY AMPUTATION;  Surgeon: Nadara Mustard, MD;  Location: [facility] OR;  Service: Orthopedics;  Laterality: Right;   SHOULDER SURGERY  1981   WISDOM TOOTH  EXTRACTION     Social History   Occupational History   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

## 2023-02-04 ENCOUNTER — Encounter: Payer: Self-pay | Admitting: Orthopedic Surgery

## 2023-02-04 ENCOUNTER — Ambulatory Visit: Payer: BLUE CROSS/BLUE SHIELD | Admitting: Orthopedic Surgery

## 2023-02-04 DIAGNOSIS — Z89411 Acquired absence of right great toe: Secondary | ICD-10-CM

## 2023-02-04 DIAGNOSIS — S98111A Complete traumatic amputation of right great toe, initial encounter: Secondary | ICD-10-CM

## 2023-02-04 NOTE — Progress Notes (Signed)
Office Visit Note   Patient: Pedro Moyer           Date of Birth: 03-17-1961           MRN: [id number] Visit Date: 02/04/2023              Requested by: No referring provider defined for this encounter. PCP: Pcp, No  Chief Complaint  Patient presents with   Right Foot - Routine Post Op    12/28/2022 right foot 1st ray amputation       HPI: Patient is a 62 year old gentleman who is 6 weeks status post right foot first ray amputation has been nonweightbearing in a kneeling scooter.  Assessment & Plan: Visit Diagnoses:  1. 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  Follow-Up Instructions: Return in about 2 weeks (around 02/18/2023).   Ortho Exam  Patient is alert, oriented, no adenopathy, well-dressed, normal affect, normal respiratory effort. Examination the incision is healing well the majority the eschar is removed and there is good healthy epithelialization.  There is no cellulitis odor or drainage.  Imaging: No results found. No images are attached to the encounter.  Labs: Lab Results  Component Value Date   HGBA1C 9.7 (H) 12/26/2022   HGBA1C 10.0 (H) 12/25/2022   HGBA1C 5.9% 09/21/2016   ESRSEDRATE 39 (H) 12/25/2022   CRP 10.6 (H) 12/25/2022   LABURIC 4.3 12/26/2022   REPTSTATUS 12/31/2022 FINAL 12/25/2022   CULT  12/25/2022    NO GROWTH 5 DAYS Performed at [facility], [address], [place], [place] [address]      Lab Results  Component Value Date   ALBUMIN 3.3 (L) 12/27/2022   ALBUMIN 4.0 12/25/2022   ALBUMIN 4.1 12/23/2022    No results found for: "MG" No results found for: "VD25OH"  No results found for: "PREALBUMIN"    Latest Ref Rng & Units 12/30/2022    3:21 AM 12/29/2022    3:44 AM 12/27/2022    7:34 AM  CBC EXTENDED  WBC 4.0 - 10.5 K/uL 9.5  10.3  12.2   RBC 4.22 - 5.81 MIL/uL 3.89  4.16  5.13   Hemoglobin 13.0 - 17.0 g/dL 44.0  10.2  72.5   HCT 39.0 - 52.0 % 32.0  34.5  42.9    Platelets 150 - 400 K/uL 255  256  286   NEUT# 1.7 - 7.7 K/uL 6.5  6.9    Lymph# 0.7 - 4.0 K/uL 1.7  1.9       There is no height or weight on file to calculate BMI.  Orders:  No orders of the defined types were placed in this encounter.  No orders of the defined types were placed in this encounter.    Procedures: No procedures performed  Clinical Data: No additional findings.  ROS:  All other systems negative, except as noted in the HPI. Review of Systems  Objective: Vital Signs: There were no vitals taken for this visit.  Specialty Comments:  No specialty comments available.  PMFS History: Patient Active Problem List   Diagnosis Date Noted   Osteomyelitis of great toe of right foot (HCC) 12/26/2022   Achilles tendon contracture, right 12/26/2022   Sepsis from diabetic infection of right foot (HCC) 12/25/2022   Sepsis (HCC) 12/25/2022   Essential hypertension 12/25/2022   Uncontrolled type 2 diabetes mellitus with hyperglycemia (HCC) 05/09/2016   Dyspnea 04/18/2016   Obstructive sleep apnea 03/19/2014  Past Medical History:  Diagnosis Date   Borderline hypertension    Diabetes (HCC)    diagnosed 2017   Diabetes mellitus type 2, controlled, without complications (HCC) 05/09/2016   Environmental allergies    Hypertension    PONV (postoperative nausea and vomiting)    Sleep apnea     Family History  Problem Relation Age of Onset   Lung cancer Mother        died at 37   Diabetes Mother    Other Father        blood disease-ETOH abuse   Cirrhosis Father    Diabetes Brother    Diabetes Brother    Diabetes Brother     Past Surgical History:  Procedure Laterality Date   AMPUTATION Right 12/28/2022   Procedure: RIGHT 1ST RAY AMPUTATION;  Surgeon: Nadara Mustard, MD;  Location: [facility] OR;  Service: Orthopedics;  Laterality: Right;   SHOULDER SURGERY  1981   WISDOM TOOTH EXTRACTION     Social History   Occupational History   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

## 2023-02-18 ENCOUNTER — Encounter: Payer: BLUE CROSS/BLUE SHIELD | Admitting: Orthopedic Surgery

## 2023-02-26 ENCOUNTER — Encounter: Payer: Self-pay | Admitting: Orthopedic Surgery

## 2023-02-26 ENCOUNTER — Ambulatory Visit: Payer: BLUE CROSS/BLUE SHIELD | Admitting: Orthopedic Surgery

## 2023-02-26 DIAGNOSIS — S98111A Complete traumatic amputation of right great toe, initial encounter: Secondary | ICD-10-CM

## 2023-02-26 DIAGNOSIS — Z89411 Acquired absence of right great toe: Secondary | ICD-10-CM

## 2023-02-26 NOTE — Progress Notes (Signed)
Patient is 2 months status post first ray amputation right foot.  There is some maceration and mild ischemic changes with swelling.  Will place him in a compression sock the importance of elevation was discussed to decrease swelling.  Reevaluate in 4 weeks.  There is no cellulitis or drainage no signs of infection.

## 2023-03-26 ENCOUNTER — Ambulatory Visit (INDEPENDENT_AMBULATORY_CARE_PROVIDER_SITE_OTHER): Payer: BLUE CROSS/BLUE SHIELD | Admitting: Orthopedic Surgery

## 2023-03-26 DIAGNOSIS — S98111A Complete traumatic amputation of right great toe, initial encounter: Secondary | ICD-10-CM

## 2023-03-26 DIAGNOSIS — Z89411 Acquired absence of right great toe: Secondary | ICD-10-CM

## 2023-03-28 ENCOUNTER — Encounter: Payer: Self-pay | Admitting: Orthopedic Surgery

## 2023-03-28 NOTE — Progress Notes (Signed)
Office Visit Note   Patient: Pedro Moyer           Date of Birth: 08/10/1960           MRN: [id number] Visit Date: 03/26/2023              Requested by: No referring provider defined for this encounter. PCP: Pcp, No  Chief Complaint  Patient presents with   Right Foot - Routine Post Op    12/28/22 1st ray amputation      HPI: Patient is a 62 year old gentleman who is 5-month status post right foot first ray amputation.  Patient states he has been soaking his foot in Epsom salt and water.  He is wearing his compression sock.  He states he played golf yesterday.  Assessment & Plan: Visit Diagnoses:  1. 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  Follow-Up Instructions: No follow-ups on file.   Ortho Exam  Patient is alert, oriented, no adenopathy, well-dressed, normal affect, normal respiratory effort. Examination the foot is well-healed there is no cellulitis no drainage there is still some swelling.  Imaging: No results found. No images are attached to the encounter.  Labs: Lab Results  Component Value Date   HGBA1C 9.7 (H) 12/26/2022   HGBA1C 10.0 (H) 12/25/2022   HGBA1C 5.9% 09/21/2016   ESRSEDRATE 39 (H) 12/25/2022   CRP 10.6 (H) 12/25/2022   LABURIC 4.3 12/26/2022   REPTSTATUS 12/31/2022 FINAL 12/25/2022   CULT  12/25/2022    NO GROWTH 5 DAYS Performed at [facility], [address], [place], [place] [address]      Lab Results  Component Value Date   ALBUMIN 3.3 (L) 12/27/2022   ALBUMIN 4.0 12/25/2022   ALBUMIN 4.1 12/23/2022    No results found for: "MG" No results found for: "VD25OH"  No results found for: "PREALBUMIN"    Latest Ref Rng & Units 12/30/2022    3:21 AM 12/29/2022    3:44 AM 12/27/2022    7:34 AM  CBC EXTENDED  WBC 4.0 - 10.5 K/uL 9.5  10.3  12.2   RBC 4.22 - 5.81 MIL/uL 3.89  4.16  5.13   Hemoglobin 13.0 - 17.0 g/dL 02.7  25.3  66.4   HCT 39.0 -  52.0 % 32.0  34.5  42.9   Platelets 150 - 400 K/uL 255  256  286   NEUT# 1.7 - 7.7 K/uL 6.5  6.9    Lymph# 0.7 - 4.0 K/uL 1.7  1.9       There is no height or weight on file to calculate BMI.  Orders:  No orders of the defined types were placed in this encounter.  No orders of the defined types were placed in this encounter.    Procedures: No procedures performed  Clinical Data: No additional findings.  ROS:  All other systems negative, except as noted in the HPI. Review of Systems  Objective: Vital Signs: There were no vitals taken for this visit.  Specialty Comments:  No specialty comments available.  PMFS History: Patient Active Problem List   Diagnosis Date Noted   Osteomyelitis of great toe of right foot (HCC) 12/26/2022   Achilles tendon contracture, right 12/26/2022   Sepsis from diabetic infection of right foot (HCC) 12/25/2022   Sepsis (HCC) 12/25/2022   Essential hypertension 12/25/2022   Uncontrolled type 2 diabetes mellitus with hyperglycemia (HCC) 05/09/2016  Dyspnea 04/18/2016   Obstructive sleep apnea 03/19/2014   Past Medical History:  Diagnosis Date   Borderline hypertension    Diabetes (HCC)    diagnosed 2017   Diabetes mellitus type 2, controlled, without complications (HCC) 05/09/2016   Environmental allergies    Hypertension    PONV (postoperative nausea and vomiting)    Sleep apnea     Family History  Problem Relation Age of Onset   Lung cancer Mother        died at 56   Diabetes Mother    Other Father        blood disease-ETOH abuse   Cirrhosis Father    Diabetes Brother    Diabetes Brother    Diabetes Brother     Past Surgical History:  Procedure Laterality Date   AMPUTATION Right 12/28/2022   Procedure: RIGHT 1ST RAY AMPUTATION;  Surgeon: Nadara Mustard, MD;  Location: [facility] OR;  Service: Orthopedics;  Laterality: Right;   SHOULDER SURGERY  1981   WISDOM TOOTH EXTRACTION     Social History   Occupational History    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
